# Patient Record
Sex: Male | Born: 1985 | Race: Black or African American | Hispanic: No | Marital: Single | Smoking: Current some day smoker
Health system: Southern US, Community
[De-identification: ages and names within clinical notes are randomized; demographics above are authoritative.]

## PROBLEM LIST (undated history)

## (undated) DIAGNOSIS — Z720 Tobacco use: Secondary | ICD-10-CM

---

## 2007-11-30 ENCOUNTER — Emergency Department (HOSPITAL_COMMUNITY): Admission: EM | Admit: 2007-11-30 | Discharge: 2007-12-01 | Payer: Self-pay | Admitting: Emergency Medicine

## 2009-02-11 ENCOUNTER — Emergency Department (HOSPITAL_COMMUNITY): Admission: EM | Admit: 2009-02-11 | Discharge: 2009-02-11 | Payer: Self-pay | Admitting: Emergency Medicine

## 2009-07-03 ENCOUNTER — Emergency Department (HOSPITAL_COMMUNITY): Admission: EM | Admit: 2009-07-03 | Discharge: 2009-07-03 | Payer: Self-pay | Admitting: Emergency Medicine

## 2010-08-18 ENCOUNTER — Emergency Department (HOSPITAL_COMMUNITY)
Admission: EM | Admit: 2010-08-18 | Discharge: 2010-08-18 | Payer: Self-pay | Source: Home / Self Care | Admitting: Emergency Medicine

## 2010-09-27 ENCOUNTER — Emergency Department (HOSPITAL_COMMUNITY)
Admission: EM | Admit: 2010-09-27 | Discharge: 2010-09-27 | Disposition: A | Discharge status: Home / Self Care | Payer: Self-pay | Attending: Emergency Medicine | Admitting: Emergency Medicine

## 2010-09-27 DIAGNOSIS — J029 Acute pharyngitis, unspecified: Secondary | ICD-10-CM | POA: Insufficient documentation

## 2010-09-27 DIAGNOSIS — R51 Headache: Secondary | ICD-10-CM | POA: Insufficient documentation

## 2010-09-27 DIAGNOSIS — R5381 Other malaise: Secondary | ICD-10-CM | POA: Insufficient documentation

## 2010-09-27 DIAGNOSIS — B9789 Other viral agents as the cause of diseases classified elsewhere: Secondary | ICD-10-CM | POA: Insufficient documentation

## 2010-09-27 DIAGNOSIS — R1110 Vomiting, unspecified: Secondary | ICD-10-CM | POA: Insufficient documentation

## 2010-10-28 LAB — POCT I-STAT, CHEM 8
BUN: 11 mg/dL (ref 6–23)
Chloride: 103 mEq/L (ref 96–112)
Creatinine, Ser: 1.2 mg/dL (ref 0.4–1.5)
Sodium: 144 mEq/L (ref 135–145)

## 2010-10-28 LAB — POCT CARDIAC MARKERS
Myoglobin, poc: 61.7 ng/mL (ref 12–200)
Troponin i, poc: <0.05 ng/mL (ref 0.00–0.09)

## 2010-10-28 LAB — RAPID URINE DRUG SCREEN, HOSP PERFORMED
Cocaine: NOT DETECTED
Opiates: NOT DETECTED
Tetrahydrocannabinol: NOT DETECTED

## 2010-11-18 ENCOUNTER — Emergency Department (HOSPITAL_COMMUNITY)
Admission: EM | Admit: 2010-11-18 | Discharge: 2010-11-19 | Disposition: A | Discharge status: Home / Self Care | Payer: Self-pay | Attending: Emergency Medicine | Admitting: Emergency Medicine

## 2010-11-18 DIAGNOSIS — Z0389 Encounter for observation for other suspected diseases and conditions ruled out: Secondary | ICD-10-CM | POA: Insufficient documentation

## 2010-11-25 LAB — BASIC METABOLIC PANEL
Calcium: 9.4 mg/dL (ref 8.4–10.5)
Creatinine, Ser: 0.79 mg/dL (ref 0.4–1.5)
GFR calc Af Amer: >60 mL/min (ref >60)
GFR calc non Af Amer: >60 mL/min (ref >60)
Sodium: 143 mEq/L (ref 135–145)

## 2010-11-25 LAB — DIFFERENTIAL
Basophils Relative: 1 % (ref 0–1)
Lymphocytes Relative: 29 % (ref 12–46)
Lymphs Abs: 2 10*3/uL (ref 0.7–4.0)
Monocytes Relative: 8 % (ref 3–12)
Neutro Abs: 4 10*3/uL (ref 1.7–7.7)
Neutrophils Relative %: 60 % (ref 43–77)

## 2010-11-25 LAB — POCT CARDIAC MARKERS
CKMB, poc: <1 ng/mL — ABNORMAL LOW (ref 1.0–8.0)
Myoglobin, poc: 40 ng/mL (ref 12–200)
Troponin i, poc: <0.05 ng/mL (ref 0.00–0.09)
Troponin i, poc: <0.05 ng/mL (ref 0.00–0.09)

## 2010-11-25 LAB — RAPID URINE DRUG SCREEN, HOSP PERFORMED
Benzodiazepines: NOT DETECTED
Cocaine: NOT DETECTED
Opiates: NOT DETECTED
Tetrahydrocannabinol: POSITIVE — AB

## 2010-11-25 LAB — CBC
Hemoglobin: 15.6 g/dL (ref 13.0–17.0)
MCHC: 32.7 g/dL (ref 30.0–36.0)
RBC: 5.25 MIL/uL (ref 4.22–5.81)
WBC: 6.7 10*3/uL (ref 4.0–10.5)

## 2013-02-15 ENCOUNTER — Encounter (HOSPITAL_COMMUNITY): Payer: Self-pay | Admitting: Emergency Medicine

## 2013-02-15 ENCOUNTER — Emergency Department (HOSPITAL_COMMUNITY)
Admission: EM | Admit: 2013-02-15 | Discharge: 2013-02-15 | Disposition: A | Discharge status: Home / Self Care | Payer: Self-pay | Attending: Emergency Medicine | Admitting: Emergency Medicine

## 2013-02-15 DIAGNOSIS — L0291 Cutaneous abscess, unspecified: Secondary | ICD-10-CM

## 2013-02-15 DIAGNOSIS — F17200 Nicotine dependence, unspecified, uncomplicated: Secondary | ICD-10-CM | POA: Insufficient documentation

## 2013-02-15 DIAGNOSIS — L02419 Cutaneous abscess of limb, unspecified: Secondary | ICD-10-CM | POA: Insufficient documentation

## 2013-02-15 MED ORDER — HYDROCODONE-ACETAMINOPHEN 5-325 MG PO TABS: 1.0000 | ORAL_TABLET | ORAL | Status: DC | PRN

## 2013-02-15 MED ORDER — SULFAMETHOXAZOLE-TRIMETHOPRIM 800-160 MG PO TABS: 1.0000 | ORAL_TABLET | Freq: Two times a day (BID) | ORAL | Status: DC

## 2013-02-15 NOTE — ED Notes (Signed)
PT. REPORTS ABSCESS AT LEFT HIP WITH DRAINAGE FOR 4 DAYS .

## 2013-02-15 NOTE — ED Notes (Signed)
Patient presents with abscess to left upper thigh. Reports that it started off as a "scractch" x several days. Had a Band-Aid over site. Patient removed Band-Aid 2 days ago. Since then, area started growing in size, warm and red. Patient used warm compress, epsom salt and abx ointment to area then covered the wound. Patient removed cover about 14 hours ago and noted that it was opened and bleeding. Now with red induration and purulent drainage. Denies any fevers, sweats or chills.

## 2013-02-15 NOTE — ED Provider Notes (Signed)
Medical screening examination/treatment/procedure(s) were performed by non-physician practitioner and as supervising physician I was immediately available for consultation/collaboration.  Doug Sou, MD 02/15/13 [phone]

## 2013-02-15 NOTE — ED Provider Notes (Signed)
   History    CSN: [id number] Arrival date & time 02/15/13  0309  First MD Initiated Contact with Patient 02/15/13 [phone]     Chief Complaint  Patient presents with  . Abscess   (Consider location/radiation/quality/duration/timing/severity/associated sxs/prior Treatment) Patient is a 27 y.o. male presenting with abscess. The history is provided by the patient. No language interpreter was used.  Abscess Location:  Leg Abscess quality: draining, painful and redness   Red streaking: no   Associated symptoms: no fever   Associated symptoms comment:  Painful swollen area to left hip x 4 days that has opened and is draining. No fever. No history of abscess.   History reviewed. No pertinent past medical history. History reviewed. No pertinent past surgical history. No family history on file. History  Substance Use Topics  . Smoking status: Current Every Day Smoker  . Smokeless tobacco: Not on file  . Alcohol Use: Yes    Review of Systems  Constitutional: Negative for fever.  Musculoskeletal: Negative for myalgias.  Skin: Positive for wound.    Allergies  Review of patient's allergies indicates no known allergies.  Home Medications   Current Outpatient Rx  Name  Route  Sig  Dispense  Refill  . acetaminophen (TYLENOL) 500 MG tablet   Oral   Take 1,000 mg by mouth every 6 (six) hours as needed for pain.         Marland Kitchen ibuprofen (ADVIL,MOTRIN) 200 MG tablet   Oral   Take 800 mg by mouth every 6 (six) hours as needed for pain.          BP 105/62  Pulse 115  Temp(Src) 98.1 F (36.7 C) (Oral)  Resp 14  SpO2 98% Physical Exam  Constitutional: He is oriented to person, place, and time. He appears well-developed and well-nourished.  Neck: Normal range of motion.  Pulmonary/Chest: Effort normal.  Musculoskeletal: Normal range of motion.  Neurological: He is alert and oriented to person, place, and time.  Skin: Skin is warm and dry.  Left anterior hip along iliac crest is a  swollen, tender lesion with central ulceration c/w cutaneous abscess.   Psychiatric: He has a normal mood and affect.    ED Course  Procedures (including critical care time) Labs Reviewed - No data to display No results found. No diagnosis found. 1. Cutaneous abscess  MDM  Miminal induration without current fluctuance. Active drainage and evidence of healing in at present. No indication for I&D.   Arnoldo Hooker, PA-C 02/15/13 [phone]

## 2016-11-03 ENCOUNTER — Encounter (HOSPITAL_COMMUNITY): Payer: Self-pay | Admitting: Emergency Medicine

## 2016-11-03 ENCOUNTER — Ambulatory Visit (HOSPITAL_COMMUNITY)
Admission: EM | Admit: 2016-11-03 | Discharge: 2016-11-03 | Disposition: A | Discharge status: Home / Self Care | Payer: Self-pay | Attending: Family Medicine | Admitting: Family Medicine

## 2016-11-03 DIAGNOSIS — R369 Urethral discharge, unspecified: Secondary | ICD-10-CM | POA: Insufficient documentation

## 2016-11-03 DIAGNOSIS — Z202 Contact with and (suspected) exposure to infections with a predominantly sexual mode of transmission: Secondary | ICD-10-CM | POA: Insufficient documentation

## 2016-11-03 MED ORDER — CEFTRIAXONE SODIUM 250 MG IJ SOLR
INTRAMUSCULAR | Status: AC
  Filled 2016-11-03: qty 250

## 2016-11-03 MED ORDER — LIDOCAINE HCL (PF) 1 % IJ SOLN
INTRAMUSCULAR | Status: AC
  Filled 2016-11-03: qty 2

## 2016-11-03 MED ORDER — AZITHROMYCIN 250 MG PO TABS
ORAL_TABLET | ORAL | Status: AC
  Filled 2016-11-03: qty 4

## 2016-11-03 MED ORDER — CEFTRIAXONE SODIUM 250 MG IJ SOLR
250.0000 mg | Freq: Once | INTRAMUSCULAR | Status: AC
  Administered 2016-11-03: 250 mg via INTRAMUSCULAR

## 2016-11-03 MED ORDER — AZITHROMYCIN 250 MG PO TABS
1000.0000 mg | ORAL_TABLET | Freq: Once | ORAL | Status: AC
  Administered 2016-11-03: 1000 mg via ORAL

## 2016-11-03 NOTE — Discharge Instructions (Signed)
If you do not improve, establish with a primary care physician. If new symptoms arise, seek care.  Wear protection if/when sexually active.

## 2016-11-03 NOTE — ED Provider Notes (Signed)
[facility]    CSN: [id number] Arrival date & time: 11/03/16  1641     History   Chief Complaint Chief Complaint  Patient presents with  . Exposure to STD    HPI Gary Ware is a 31 y.o. male.   HPI  Around 1 week ago, the patient started experiencing a clear penile discharge. He is not been sexually active in around 1 year. He has no history of herpes, chlamydia, gonorrhea, or HIV. He denies any fevers, abdominal pain, testicular pain, pain with urination, or bleeding. He is not known to have a partner with any sexual transmitted disease.  History reviewed. No pertinent past medical history.  History reviewed. No pertinent surgical history.   Home Medications    Prior to Admission medications   Medication Sig Start Date End Date Taking? Authorizing Provider  acetaminophen (TYLENOL) 500 MG tablet Take 1,000 mg by mouth every 6 (six) hours as needed for pain.    Historical Provider, MD  ibuprofen (ADVIL,MOTRIN) 200 MG tablet Take 800 mg by mouth every 6 (six) hours as needed for pain.    Historical Provider, MD    Family History No hx of cancer  Social History Social History  Substance Use Topics  . Smoking status: Current Every Day Smoker  . Alcohol use Yes     Allergies   Patient has no known allergies.   Review of Systems Review of Systems  Constitutional: Negative for fever.  Genitourinary: Positive for discharge.     Physical Exam Triage Vital Signs ED Triage Vitals [11/03/16 1737]  Enc Vitals Group     BP 109/63     Pulse Rate 71     Resp 18     Temp 98.7 F (37.1 C)     Temp Source Oral     SpO2 100 %   Updated Vital Signs BP 109/63 (BP Location: Right Arm)   Pulse 71   Temp 98.7 F (37.1 C) (Oral)   Resp 18   SpO2 100%   Physical Exam  Constitutional: He appears well-developed and well-nourished. No distress.  HENT:  Head: Normocephalic and atraumatic.  Eyes: EOM are normal. Pupils are equal, round, and reactive  to light.  Cardiovascular: Normal rate and regular rhythm.   No murmur heard. Pulmonary/Chest: Effort normal and breath sounds normal. No respiratory distress.  Abdominal: Soft. Bowel sounds are normal. He exhibits no distension. There is no tenderness.  Genitourinary: Penis normal. No penile tenderness.  Skin: Skin is warm.  Psychiatric: He has a normal mood and affect. Judgment normal.    UC Treatments / Results  Procedures Procedures - none  Medications Ordered in UC Medications  azithromycin (ZITHROMAX) tablet 1,000 mg (1,000 mg Oral Given 11/03/16 1812)  cefTRIAXone (ROCEPHIN) injection 250 mg (250 mg Intramuscular Given 11/03/16 1813)     Initial Impression / Assessment and Plan / UC Course  I have reviewed the triage vital signs and the nursing notes.  Pertinent labs & imaging results that were available during my care of the patient were reviewed by me and considered in my medical decision making (see chart for details).     31 yo BM presents with penile discharge. We will empirically treat him for gonorrhea/chlamydia. Urine culture and STI panel ordered. Advised him to establish a primary care physician follow-up with if treatment isn't helpful and if he continues to have the discharge. The patient voiced understanding and agreement to the plan.  Final Clinical Impressions(s) / UC  Diagnoses   Final diagnoses:  Penile discharge     Jilda Rocheicholas Paul GirardWendling, DO 11/03/16 1830

## 2016-11-03 NOTE — ED Triage Notes (Signed)
Patient reports penile discharge started on 3/15

## 2016-11-03 NOTE — ED Notes (Signed)
Sent to the bathroom with instructions for dirty and clean urine

## 2016-11-04 LAB — URINE CYTOLOGY ANCILLARY ONLY
CHLAMYDIA, DNA PROBE: POSITIVE — AB
NEISSERIA GONORRHEA: POSITIVE — AB
TRICH (WINDOWPATH): NEGATIVE

## 2017-05-29 ENCOUNTER — Other Ambulatory Visit: Payer: Self-pay

## 2017-05-30 ENCOUNTER — Emergency Department (HOSPITAL_COMMUNITY)
Admission: EM | Admit: 2017-05-30 | Discharge: 2017-05-30 | Disposition: A | Discharge status: Home / Self Care | Payer: Managed Care, Other (non HMO) | Attending: Emergency Medicine | Admitting: Emergency Medicine

## 2017-05-30 ENCOUNTER — Emergency Department (HOSPITAL_COMMUNITY): Payer: Managed Care, Other (non HMO)

## 2017-05-30 ENCOUNTER — Encounter (HOSPITAL_COMMUNITY): Payer: Self-pay | Admitting: Emergency Medicine

## 2017-05-30 DIAGNOSIS — F1520 Other stimulant dependence, uncomplicated: Secondary | ICD-10-CM

## 2017-05-30 DIAGNOSIS — R079 Chest pain, unspecified: Secondary | ICD-10-CM | POA: Diagnosis present

## 2017-05-30 DIAGNOSIS — F17200 Nicotine dependence, unspecified, uncomplicated: Secondary | ICD-10-CM | POA: Insufficient documentation

## 2017-05-30 LAB — BASIC METABOLIC PANEL
Anion gap: 8 (ref 5–15)
BUN: 11 mg/dL (ref 6–20)
CO2: 26 mmol/L (ref 22–32)
CREATININE: 0.79 mg/dL (ref 0.61–1.24)
Calcium: 9.3 mg/dL (ref 8.9–10.3)
Chloride: 102 mmol/L (ref 101–111)
GFR calc Af Amer: >60 mL/min (ref >60)
GLUCOSE: 98 mg/dL (ref 65–99)
POTASSIUM: 3.9 mmol/L (ref 3.5–5.1)
Sodium: 136 mmol/L (ref 135–145)

## 2017-05-30 LAB — I-STAT TROPONIN, ED
TROPONIN I, POC: 0 ng/mL (ref 0.00–0.08)
Troponin i, poc: 0.01 ng/mL (ref 0.00–0.08)

## 2017-05-30 LAB — CBC
HEMATOCRIT: 42.3 % (ref 39.0–52.0)
HEMOGLOBIN: 14.2 g/dL (ref 13.0–17.0)
MCH: 29.8 pg (ref 26.0–34.0)
MCHC: 33.6 g/dL (ref 30.0–36.0)
MCV: 88.7 fL (ref 78.0–100.0)
Platelets: 256 10*3/uL (ref 150–400)
RBC: 4.77 MIL/uL (ref 4.22–5.81)
RDW: 12.4 % (ref 11.5–15.5)
WBC: 4.7 10*3/uL (ref 4.0–10.5)

## 2017-05-30 MED ORDER — KETOROLAC TROMETHAMINE 60 MG/2ML IM SOLN
30.0000 mg | Freq: Once | INTRAMUSCULAR | Status: AC
  Administered 2017-05-30: 30 mg via INTRAMUSCULAR
  Filled 2017-05-30: qty 2

## 2017-05-30 NOTE — ED Triage Notes (Signed)
C/o intermittent sharp pain to L chest with mild intermittent sob and nausea x 2-3 days.  Also reports generalized body aches.  Denies fever.

## 2017-05-30 NOTE — ED Notes (Signed)
Got patient up and ready for discharge

## 2017-05-30 NOTE — ED Provider Notes (Signed)
[facility]   CSN: [id number] Arrival date & time: 05/30/17  0001     History   Chief Complaint Chief Complaint  Patient presents with  . Chest Pain    HPI Gary Ware is a 31 y.o. male.  The history is provided by the patient and medical records. No language interpreter was used.  Chest Pain   Associated symptoms include shortness of breath. Pertinent negatives include no cough.   Gary Ware is an otherwise healthy 31 y.o. male who presents to the Emergency Department complaining of intermittent sharp, tingling left-sided chest pain x 2 days. Pain occurred 2-3 times the first day, lasting about 15-30 minutes then self-resolving. Last night, pain began and lasted over an hour, prompting him to come to ER for evaluation. Currently feels a little sore, but pain resolved. Initially did have some shortness of breath, nausea and "jitters", but that also has resolved. Of note, patient states that his job has been very stressful. 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 Last night, he also states that he drank two energy drinks along with a cup of black coffee. Last night was the first time that he took this much caffeine at one time. No recent travel, immobilizations or surgeries. No cough, congestion, fever, chills, back pain, abdominal pain. No hx of HTN, HLD, DM of heart disease. No family cardiac hx.   History reviewed. No pertinent past medical history.  There are no active problems to display for this patient.   History reviewed. No pertinent surgical history.     Home Medications    Prior to Admission medications   Medication Sig Start Date End Date Taking? Authorizing Provider  acetaminophen (TYLENOL) 500 MG tablet Take 1,000 mg by mouth every 6 (six) hours as needed for pain.   Yes [provider]  ibuprofen (ADVIL,MOTRIN) 200 MG tablet Take 800 mg by mouth every 6 (six)  hours as needed for pain.   Yes [provider]    Family History No family history on file.  Social History Social History  Substance Use Topics  . Smoking status: Current Every Day Smoker  . Smokeless tobacco: Never Used  . Alcohol use Yes     Allergies   Patient has no known allergies.   Review of Systems Review of Systems  Respiratory: Positive for shortness of breath. Negative for cough and wheezing.   Cardiovascular: Positive for chest pain. Negative for leg swelling.  All other systems reviewed and are negative.    Physical Exam Updated Vital Signs BP 120/72 (BP Location: Left Arm)   Pulse (!) 56   Temp 97.7 F (36.5 C) (Oral)   Resp 18   SpO2 100%   Physical Exam  Constitutional: He is oriented to person, place, and time. He appears well-developed and well-nourished. No distress.  HENT:  Head: Normocephalic and atraumatic.  Neck: Neck supple. No JVD present.  Cardiovascular: Normal rate and regular rhythm.   No murmur heard. Pulmonary/Chest: Effort normal and breath sounds normal. No respiratory distress. He has no wheezes. He has no rales. He exhibits tenderness.  Abdominal: Soft. He exhibits no distension. There is no tenderness.  Musculoskeletal: He exhibits no edema.  Neurological: He is alert and oriented to person, place, and time.  Skin: Skin is warm and dry.  Nursing note and vitals reviewed.    ED Treatments / Results  Labs (all labs  ordered are listed, but only abnormal results are displayed) Labs Reviewed  BASIC METABOLIC PANEL  CBC  I-STAT TROPONIN, ED  I-STAT TROPONIN, ED    EKG  EKG Interpretation  Date/Time:  Saturday May 30 2017 00:14:56 EDT Ventricular Rate:  65 PR Interval:  166 QRS Duration: 76 QT Interval:  400 QTC Calculation: 416 R Axis:   94 Text Interpretation:  Normal sinus rhythm with sinus arrhythmia Confirmed by Nicanor Alcon, April ([id number]) on 05/30/2017 8:07:16 AM       Radiology Dg Chest 2  View  Result Date: 05/30/2017 CLINICAL DATA:  Intermittent sharp left chest pain with dyspnea and nausea for several days. EXAM: CHEST  2 VIEW COMPARISON:  08/18/2010 FINDINGS: The lungs are clear. The pulmonary vasculature is normal. Heart size is normal. Hilar and mediastinal contours are unremarkable. There is no pleural effusion. IMPRESSION: No active cardiopulmonary disease. Electronically Signed   By: Ellery Plunk M.D.   On: 05/30/2017 00:52    Procedures Procedures (including critical care time)  Medications Ordered in ED Medications  ketorolac (TORADOL) injection 30 mg (30 mg Intramuscular Given 05/30/17 0655)     Initial Impression / Assessment and Plan / ED Course  I have reviewed the triage vital signs and the nursing notes.  Pertinent labs & imaging results that were available during my care of the patient were reviewed by me and considered in my medical decision making (see chart for details).    Gary Ware is a 31 y.o. male who presents to ED for chest pain.  Labs reviewed and reassuring with negative troponin x2.  CXR with no acute abnormalities.  EKG reassuring.  Heart score 1. PERC negative  Labs and imaging reviewed again prior to discharge. Patient has been advised to return to the ED if development of any exertional chest pain, trouble breathing, new/worsening symptoms or for any additional concerns. Evaluation does not show pathology that would require ongoing emergent intervention or inpatient treatment. Encouraged to follow up with cardiology. Referral information included in discharge paperwork. Patient understands return precautions and follow up plan. All questions answered.   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

## 2017-05-30 NOTE — Discharge Instructions (Signed)
It was my pleasure taking care of you today!   Decrease caffeine intake!   You were seen in the Emergency Department today for chest pain.  As we have discussed, today?s blood work and imaging are normal, but you may require further testing.  Please call the cardiology clinic listed to schedule a follow up appointment for further evaluation of the chest pain you experienced today. This follow up appointment is very important!  Return to the Emergency Department if you experience any further chest pain/pressure/tightness, difficulty breathing, sudden sweating, or other symptoms that concern you.

## 2017-10-08 ENCOUNTER — Emergency Department (HOSPITAL_COMMUNITY)
Admission: EM | Admit: 2017-10-08 | Discharge: 2017-10-08 | Disposition: A | Discharge status: Home / Self Care | Payer: Managed Care, Other (non HMO) | Attending: Emergency Medicine | Admitting: Emergency Medicine

## 2017-10-08 ENCOUNTER — Other Ambulatory Visit: Payer: Self-pay

## 2017-10-08 ENCOUNTER — Encounter (HOSPITAL_COMMUNITY): Payer: Self-pay | Admitting: *Deleted

## 2017-10-08 ENCOUNTER — Emergency Department (HOSPITAL_COMMUNITY): Payer: Managed Care, Other (non HMO)

## 2017-10-08 DIAGNOSIS — R079 Chest pain, unspecified: Secondary | ICD-10-CM

## 2017-10-08 DIAGNOSIS — M898X1 Other specified disorders of bone, shoulder: Secondary | ICD-10-CM

## 2017-10-08 DIAGNOSIS — R9431 Abnormal electrocardiogram [ECG] [EKG]: Secondary | ICD-10-CM | POA: Insufficient documentation

## 2017-10-08 DIAGNOSIS — M25512 Pain in left shoulder: Secondary | ICD-10-CM | POA: Diagnosis present

## 2017-10-08 DIAGNOSIS — F17200 Nicotine dependence, unspecified, uncomplicated: Secondary | ICD-10-CM | POA: Diagnosis not present

## 2017-10-08 DIAGNOSIS — G8929 Other chronic pain: Secondary | ICD-10-CM | POA: Diagnosis not present

## 2017-10-08 HISTORY — DX: Tobacco use: Z72.0

## 2017-10-08 LAB — BASIC METABOLIC PANEL
Anion gap: 12 (ref 5–15)
BUN: 8 mg/dL (ref 6–20)
CALCIUM: 9.8 mg/dL (ref 8.9–10.3)
CO2: 24 mmol/L (ref 22–32)
CREATININE: 0.73 mg/dL (ref 0.61–1.24)
Chloride: 103 mmol/L (ref 101–111)
GFR calc non Af Amer: >60 mL/min (ref >60)
GLUCOSE: 88 mg/dL (ref 65–99)
Potassium: 3.9 mmol/L (ref 3.5–5.1)
Sodium: 139 mmol/L (ref 135–145)

## 2017-10-08 LAB — CBC
HCT: 45.9 % (ref 39.0–52.0)
Hemoglobin: 15.5 g/dL (ref 13.0–17.0)
MCH: 30.5 pg (ref 26.0–34.0)
MCHC: 33.8 g/dL (ref 30.0–36.0)
MCV: 90.4 fL (ref 78.0–100.0)
PLATELETS: 329 10*3/uL (ref 150–400)
RBC: 5.08 MIL/uL (ref 4.22–5.81)
RDW: 12.7 % (ref 11.5–15.5)
WBC: 5.9 10*3/uL (ref 4.0–10.5)

## 2017-10-08 LAB — C-REACTIVE PROTEIN: CRP: <0.8 mg/dL (ref <1.0)

## 2017-10-08 LAB — SEDIMENTATION RATE: Sed Rate: 9 mm/hr (ref 0–16)

## 2017-10-08 LAB — I-STAT TROPONIN, ED: TROPONIN I, POC: 0 ng/mL (ref 0.00–0.08)

## 2017-10-08 LAB — D-DIMER, QUANTITATIVE: D-Dimer, Quant: <0.27 ug/mL-FEU (ref 0.00–0.50)

## 2017-10-08 MED ORDER — PANTOPRAZOLE SODIUM 20 MG PO TBEC: 20.0000 mg | DELAYED_RELEASE_TABLET | Freq: Every day | ORAL | 0 refills | Status: DC

## 2017-10-08 NOTE — ED Triage Notes (Signed)
Pt states woke up yesterday with left shoulder blade pain that hurts with movement and palpation of area.  No chest pain

## 2017-10-08 NOTE — ED Notes (Signed)
D-dimer label sent to main lab. Per main lab, will call if blue top specimen not in main lab.

## 2017-10-08 NOTE — ED Notes (Signed)
XR at bedside

## 2017-10-08 NOTE — ED Notes (Signed)
Pt's name called x4 for triage.  No response.

## 2017-10-08 NOTE — ED Notes (Signed)
Cardiology at bedside.

## 2017-10-08 NOTE — ED Notes (Signed)
While Pt was in A05, he told this tech that he had experienced similar shoulder pain last week when he drank "an energy drink or two and a caffeine pill".

## 2017-10-08 NOTE — Discharge Instructions (Signed)
Take ibuprofen 400 mg every 8 hours-do not use longer than 1-2 weeks Use protonix Follow up with primary care doctor in 1-2 weeks

## 2017-10-08 NOTE — Consult Note (Addendum)
Cardiology Consultation:   Patient ID: Gary Ware; [id number]; 17-Nov-1985   Admit date: 10/08/2017 Date of Consult: 10/08/2017  Primary Care Provider: Patient, No Pcp Per Primary Cardiologist: Kristeen Miss, MD - new Primary Electrophysiologist:     Patient Profile:   Gary Ware is a 32 y.o. male with a hx of upper respiratory tract infection, suspected to be viral, who is being seen today for the evaluation of chest pain at the request of Dr. Rosalia Hammers.  History of Present Illness:   Mr. Kluever has a PMH significant for smoking. He presented to [facility] after waking up yesterday with left subscapular pain described as sharp. He woke up and then noticed the pain rated as a 4/10. He states the pain has been constant, but waxes and wanes in intensity. He states his left scapular pain lasted all day yesterday and he again awoke this morning with the pain and decided to be evaluated. The pain hurts with movement of his extremities and is reproducible by palpation on exam. The pain hurts worse when he lays down flat to sleep. He stated that he was unable to bend over to pick up something on the floor this morning because the pain was worse with bending over. He denies associated symptoms. 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 His job is physically demanding and he denies exertional chest pain and DOE. He denies problems with bleeding, lower extremity swelling, palpitations, and elevated blood pressure. He had a viral URI/head cold last week and is about 95% over it today. He denied fever. He does not have a family history of heart disease. He is a current smoker who quit 2 days ago at the request of his 17 yo daughter. He has no other risk factors for ACS.  In the ER, initial troponin negative. EKG with diffuse ST elevations. Reviewed with Dr. Eldridge Dace and STEMI was not called. EKG with LVH. Blood pressure controlled in ED. He is currently chest pain free while sitting up at almost a 90 degree  angle. He states the pain subsided 15-20 min prior.  Of note, the patient was seen in the ED for chest pain 05/30/17 thought to related to intake of copious amounts of caffeine. He was discharged without further ischemic workup.    Past Medical History:  Diagnosis Date  . Tobacco use     History reviewed. No pertinent surgical history.   Home Medications:  Prior to Admission medications   Medication Sig Start Date End Date Taking? Authorizing Provider  acetaminophen (TYLENOL) 500 MG tablet Take 1,000 mg by mouth every 6 (six) hours as needed for pain.    [provider]  ibuprofen (ADVIL,MOTRIN) 200 MG tablet Take 800 mg by mouth every 6 (six) hours as needed for pain.    [provider]    Inpatient Medications: Scheduled Meds:  Continuous Infusions:  PRN Meds:   Allergies:   No Known Allergies  Social History:   Social History   Socioeconomic History  . Marital status: Single    Spouse name: Not on file  . Number of children: Not on file  . Years of education: Not on file  . Highest education level: Not on file  Social Needs  . Financial resource strain: Not on file  . Food insecurity - worry: Not on file  . Food insecurity - inability: Not on file  . Transportation needs - medical: Not on file  . Transportation needs - non-medical: Not on file  Occupational History  .  Not on file  Tobacco Use  . Smoking status: Current Every Day Smoker  . Smokeless tobacco: Never Used  Substance and Sexual Activity  . Alcohol use: Yes  . Drug use: No  . Sexual activity: Not on file  Other Topics Concern  . Not on file  Social History Narrative  . Not on file    Family History:    Family History  Problem Relation Age of Onset  . Hypertension Maternal Grandmother      ROS:  Please see the history of present illness.  All other ROS reviewed and negative.     Physical Exam/Data:   Vitals:   10/08/17 1200 10/08/17 1215 10/08/17 1230 10/08/17 1246   BP:  105/76 116/73 114/69  Pulse: 76 64 (!) 58 85  Resp: 13 18 18 17   Temp:      TempSrc:      SpO2: 100% 100% 100% 100%   No intake or output data in the 24 hours ending 10/08/17 1309 There were no vitals filed for this visit. There is no height or weight on file to calculate BMI.  General:  Well nourished, well developed, in no acute distress HEENT: normal Neck: no JVD Vascular: No carotid bruits  Cardiac:  normal S1, S2; RRR; no murmur; pain with palpation along his left scapula, pain with upper extremity movement Lungs:  clear to auscultation bilaterally, no wheezing, rhonchi or rales  Abd: soft, nontender, no hepatomegaly  Ext: no edema Musculoskeletal:  No deformities, BUE and BLE strength normal and equal Skin: warm and dry  Neuro:  CNs 2-12 intact, no focal abnormalities noted Psych:  Normal affect   EKG:  The EKG was personally reviewed and demonstrates:  Diffuse ST elevation and LVH Telemetry:  Telemetry was personally reviewed and demonstrates:  sinus  Relevant CV Studies:  none  Laboratory Data:  Chemistry Recent Labs  Lab 10/08/17 1130  NA 139  K 3.9  CL 103  CO2 24  GLUCOSE 88  BUN 8  CREATININE 0.73  CALCIUM 9.8  GFRNONAA >60  GFRAA >60  ANIONGAP 12    No results for input(s): PROT, ALBUMIN, AST, ALT, ALKPHOS, BILITOT in the last 168 hours. Hematology Recent Labs  Lab 10/08/17 1130  WBC 5.9  RBC 5.08  HGB 15.5  HCT 45.9  MCV 90.4  MCH 30.5  MCHC 33.8  RDW 12.7  PLT 329   Cardiac EnzymesNo results for input(s): TROPONINI in the last 168 hours.  Recent Labs  Lab 10/08/17 1135  TROPIPOC 0.00    BNPNo results for input(s): BNP, PROBNP in the last 168 hours.  DDimer No results for input(s): DDIMER in the last 168 hours.  Radiology/Studies:  Dg Chest Port 1 View  Result Date: 10/08/2017 CLINICAL DATA:  Chest pain with abnormal electrocardiogram EXAM: PORTABLE CHEST 1 VIEW COMPARISON:  May 30, 2017 FINDINGS: There is no edema or  consolidation. Heart size and pulmonary vascularity are normal. No adenopathy. No pneumothorax. No bone lesions. IMPRESSION: No edema or consolidation. Electronically Signed   By: Bretta Bang III M.D.   On: 10/08/2017 12:05    Assessment and Plan:   1. Chest pain with EKG changes Given his atypical symptoms, pain worse with inspiration, laying flat, bending over, and palpation on exam, EKG changes, and recent viral URI, I suspect this chest pain may be related to his prior URI. He is currently chest pain free while sitting nearly 90 degrees. He is a smoker, but quit two days  ago. He has no other ACS risk factors.   Will order sed rate and CRP. If normal, Ok to discharge. Recommend treating with 600-800 mg ibuprofen TID for 1-2 weeks. Also discharge on PPI.    2. LVH Instructed patient to take his blood pressure at home.    3. Tobacco use Pt states he quit 2 days ago at the request of his 795 yo daughter. Congratulated and encouraged cessation.     For questions or updates, please contact [facility] Please consult [email or web address] for contact info under Cardiology/STEMI.   Signed, Marcelino Dusterngela Nicole Duke, [place]  10/08/2017 1:09 PM  Attending Note:   The patient was seen and examined.  Agree with assessment and plan as noted above.  Changes made to the above note as needed.  Patient seen and independently examined with Bettina GaviaAngie Duke, PA .   We discussed all aspects of the encounter. I agree with the assessment and plan as stated above.  1.  Chest discomfort: The patient's chest pain is very atypical.  It sounds either musculoskeletal but also could be due to pleurisy.  Tends to worsen with twisting or turning of his torso and also taking a deep breath.  He does not necessarily get worse with lying supine in bed or sitting upright.  He does not have a rub and does not have evidence of pericarditis on EKG.  He does have early repolarization which is very common for young black  males.   Committed he take Motrin 400 mg to 600 mg 3 times a day for the next several days.  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  He thinks that he can get back to work on Saturday.  I told him I be happy to see him if he has any further episodes of chest discomfort that sounds concerning for cardiac disease.  He he will need to get a primary medical doctor.   I have spent a total of 40 minutes with patient reviewing hospital  notes , telemetry, EKGs, labs and examining patient as well as establishing an assessment and plan that was discussed with the patient. > 50% of time was spent in direct patient care.    Vesta MixerPhilip J. Nahser, Montez HagemanJr., MD, Erlanger North HospitalFACC 10/08/2017, 3:53 PM [address],  [address] Office [phone]- [phone] Pager [phone]

## 2017-10-08 NOTE — ED Provider Notes (Addendum)
[facility] EMERGENCY DEPARTMENT Provider Note   CSN: [id number] Arrival date & time: 10/08/17  4098     History   Chief Complaint Chief Complaint  Patient presents with  . Shoulder Pain  . Abnormal ECG    HPI Gary Ware is a 32 y.o. male.  HPI  32 year old male presents today with 24 hours of waxing and waning left subscapular sharp pain.  He describes it as coming and going and lasting only 4 minutes.  From my evaluation he is not having it.  However he did have it initially and an EKG was obtained.  He denies any other symptoms.  He has had a recent viral URI symptoms.  He denies shortness of breath, anterior or substernal chest pain, sweating, nausea, vomiting, or diarrhea.  He has a history of cigarette smoking but states he quit 2 days ago and at that time it was only occasional.  He denies any other smoking or drug use.  He does not have any significant family history of coronary artery disease.  He denies any history of DVT or PE.  History reviewed. No pertinent past medical history.  There are no active problems to display for this patient.   History reviewed. No pertinent surgical history.     Home Medications    Prior to Admission medications   Medication Sig Start Date End Date Taking? Authorizing Provider  acetaminophen (TYLENOL) 500 MG tablet Take 1,000 mg by mouth every 6 (six) hours as needed for pain.    [provider]  ibuprofen (ADVIL,MOTRIN) 200 MG tablet Take 800 mg by mouth every 6 (six) hours as needed for pain.    [provider]    Family History No family history on file.  Social History Social History   Tobacco Use  . Smoking status: Current Every Day Smoker  . Smokeless tobacco: Never Used  Substance Use Topics  . Alcohol use: Yes  . Drug use: No     Allergies   Patient has no known allergies.   Review of Systems Review of Systems  All other systems reviewed and are  negative.    Physical Exam Updated Vital Signs BP 118/72 (BP Location: Right Arm)   Pulse 84   Temp 98.4 F (36.9 C) (Oral)   Resp 18   SpO2 98%   Physical Exam  Constitutional: He is oriented to person, place, and time. He appears well-developed and well-nourished.  HENT:  Head: Normocephalic.  Right Ear: External ear normal.  Left Ear: External ear normal.  Mouth/Throat: Oropharynx is clear and moist.  Eyes: Pupils are equal, round, and reactive to light.  Neck: Normal range of motion.  Cardiovascular: Normal rate, regular rhythm and normal heart sounds.  No murmur or rub noted  Pulmonary/Chest: Effort normal and breath sounds normal.  Abdominal: Soft. Bowel sounds are normal.  Musculoskeletal: Normal range of motion.  Neurological: He is oriented to person, place, and time.  Skin: Skin is warm and dry. Capillary refill takes less than 2 seconds.  Psychiatric: He has a normal mood and affect.  Nursing note and vitals reviewed.    ED Treatments / Results  Labs (all labs ordered are listed, but only abnormal results are displayed) Labs Reviewed  BASIC METABOLIC PANEL  CBC  I-STAT TROPONIN, ED    EKG  EKG Interpretation  Date/Time:  Thursday October 08 2017 11:14:18 EST Ventricular Rate:  85 PR Interval:    QRS Duration: 78 QT Interval:  351 QTC Calculation: 418 R Axis:   108 Text Interpretation:  Sinus rhythm LVH by voltage Inferior infarct, acute (LCx) Lateral leads are also involved Confirmed by Margarita Grizzleay, Denise Washburn [phone]([id number]) on 10/08/2017 11:47:38 AM       EKG Interpretation  Date/Time:  Thursday October 08 2017 11:39:26 EST Ventricular Rate:  57 PR Interval:    QRS Duration: 94 QT Interval:  408 QTC Calculation: 398 R Axis:   109 Text Interpretation:  Sinus arrhythmia Right axis deviation ST elevation suggests acute pericarditis Confirmed by Margarita Grizzleay, Karter Hellmer [phone]([id number]) on 10/08/2017 11:55:44 AM       Radiology Dg Chest Port 1 View  Result Date:  10/08/2017 CLINICAL DATA:  Chest pain with abnormal electrocardiogram EXAM: PORTABLE CHEST 1 VIEW COMPARISON:  May 30, 2017 FINDINGS: There is no edema or consolidation. Heart size and pulmonary vascularity are normal. No adenopathy. No pneumothorax. No bone lesions. IMPRESSION: No edema or consolidation. Electronically Signed   By: Bretta BangWilliam  Woodruff III M.D.   On: 10/08/2017 12:05    Procedures Procedures (including critical care time)  Medications Ordered in ED Medications - No data to display   Initial Impression / Assessment and Plan / ED Course  I have reviewed the triage vital signs and the nursing notes.  Pertinent labs & imaging results that were available during my care of the patient were reviewed by me and considered in my medical decision making (see chart for details).    11:51 AM Discussed ekg changes with Dr. Eldridge DaceVaranasi- agrees with NOT calling STEMI as symptoms atypical.  Repeat ekg less pronounced.  Will evaluate for MI, other cardiac etiologies and pulmonary- specfically pe.  Troponin negative D-dimer normal  Reviewed cardiology note- thank you crp normal Will start protonix and ibuprofen Patient advised regarding need for follow up and return precautions  Final Clinical Impressions(s) / ED Diagnoses   Final diagnoses:  Chronic scapular pain  Abnormal EKG    ED Discharge Orders        Ordered    pantoprazole (PROTONIX) 20 MG tablet  Daily     10/08/17 1511       Margarita Grizzleay, Tova Vater, MD 10/08/17 1513    Margarita Grizzleay, Erandi Lemma, MD 10/08/17 [phone]

## 2017-10-10 ENCOUNTER — Encounter (HOSPITAL_COMMUNITY): Payer: Self-pay | Admitting: *Deleted

## 2017-10-10 ENCOUNTER — Ambulatory Visit (HOSPITAL_COMMUNITY)
Admission: EM | Admit: 2017-10-10 | Discharge: 2017-10-10 | Disposition: A | Discharge status: Home / Self Care | Payer: Managed Care, Other (non HMO) | Attending: Family Medicine | Admitting: Family Medicine

## 2017-10-10 ENCOUNTER — Other Ambulatory Visit: Payer: Self-pay

## 2017-10-10 DIAGNOSIS — M25512 Pain in left shoulder: Secondary | ICD-10-CM

## 2017-10-10 DIAGNOSIS — M545 Low back pain, unspecified: Secondary | ICD-10-CM

## 2017-10-10 MED ORDER — CYCLOBENZAPRINE HCL 10 MG PO TABS: 10.0000 mg | ORAL_TABLET | Freq: Two times a day (BID) | ORAL | 0 refills | Status: DC | PRN

## 2017-10-10 NOTE — ED Provider Notes (Signed)
  [facility]    CSN: [id number] Arrival date & time: 10/10/17  1927  Musculoskeletal Exam  Patient: Gary Ware DOB: 11/03/1985  DOS: 10/10/2017  SUBJECTIVE:  Chief Complaint:   Chief Complaint  Patient presents with  . Shoulder Pain    Gary Ware is a 32 y.o.  male for evaluation and treatment of shoulder pain.   Onset:  3 days ago.  No inj, seen in ED and rx'd meds given time off of work.  He is involved in physical work and does not believe he can return tonight or maybe tomorrow.  He has Monday off..   Today, he also started having lower back pain.  It is lower and central.  No injury or change in activity.  He does not stretch routinely.  No weakness, numbness, tingling, or loss of bowel/bladder function.  ROS: Musculoskeletal/Extremities: +LBP  Past Medical History:  Diagnosis Date  . Tobacco use     Objective: VITAL SIGNS: BP 118/72 (BP Location: Right Arm)   Pulse 73   Temp 97.6 F (36.4 C) (Oral)   SpO2 98%  Constitutional: Well formed, well developed. No acute distress. Cardiovascular: Brisk cap refill Thorax & Lungs: No accessory muscle use Musculoskeletal: Low back.   Tenderness to palpation: yes, lumbar paraspinal msk b/l Deformity: no Ecchymosis: no Tests negative: Straight leg Neurologic: Normal sensory function. No focal deficits noted. DTR's equal and symmetry in LE's. No clonus. Psychiatric: Normal mood. Age appropriate judgment and insight. Alert & oriented x 3.    Assessment:  Acute bilateral low back pain without sciatica  Acute pain of left shoulder  Plan: Add Flexeril. NSAID, Tylenol, stretches/exericses. Letter excusing him tonight and tomorrow night with return the evening of 10/12/17 given. Find pcp. The patient voiced understanding and agreement to the plan.    Sharlene DoryWendling, Shacoria Latif Paul, [place]DO 10/10/17 2253

## 2017-10-10 NOTE — ED Triage Notes (Signed)
Lower back and shoulder pain .

## 2017-10-10 NOTE — Discharge Instructions (Signed)
Heat (pad or rice pillow in microwave) over affected area, 10-15 minutes twice daily.   Ibuprofen 400-600 mg (2-3 over the counter strength tabs) every 6 hours as needed for pain.  OK to take Tylenol 1000 mg (2 extra strength tabs) or 975 mg (3 regular strength tabs) every 6 hours as needed.  Ice/cold pack over area for 10-15 min twice daily.  Keep stretching.

## 2017-11-01 ENCOUNTER — Encounter (HOSPITAL_COMMUNITY): Payer: Self-pay | Admitting: Emergency Medicine

## 2017-11-01 ENCOUNTER — Ambulatory Visit (HOSPITAL_COMMUNITY)
Admission: EM | Admit: 2017-11-01 | Discharge: 2017-11-01 | Disposition: A | Discharge status: Home / Self Care | Payer: Managed Care, Other (non HMO) | Attending: Physician Assistant | Admitting: Physician Assistant

## 2017-11-01 DIAGNOSIS — J069 Acute upper respiratory infection, unspecified: Secondary | ICD-10-CM

## 2017-11-01 MED ORDER — CETIRIZINE-PSEUDOEPHEDRINE ER 5-120 MG PO TB12: 1.0000 | ORAL_TABLET | Freq: Two times a day (BID) | ORAL | 0 refills | Status: AC

## 2017-11-01 MED ORDER — NAPROXEN 500 MG PO TABS: 500.0000 mg | ORAL_TABLET | Freq: Two times a day (BID) | ORAL | 0 refills | Status: DC

## 2017-11-01 NOTE — ED Triage Notes (Signed)
Pt c/o cough, sneezing, since yesterday. Pt also c/o scratchy throat.

## 2017-11-01 NOTE — Discharge Instructions (Signed)
Take medication as prescribed.  I think it smart to stay out of work for the next couple of days.  Drink lots and lots of water.

## 2017-11-01 NOTE — ED Provider Notes (Signed)
11/01/2017 3:51 PM   DOB: 12/08/1985 / MRN: [id number]  SUBJECTIVE:  Gary Ware is a 32 y.o. male presenting for cough, nasal congestion, scratchy throat.  Has not had the flu shot.  Has no history of asthma or diabetes.  Eyes fever, chest pain, shortness of breath, DOE, leg swelling.  He has No Known Allergies.   He  has a past medical history of Tobacco use.    He  reports that he has been smoking.  he has never used smokeless tobacco. He reports that he drinks alcohol. He reports that he does not use drugs. He  has no sexual activity history on file. The patient  has no past surgical history on file.  His family history includes Hypertension in his maternal grandmother.  ROS per HPI  OBJECTIVE:  BP 123/71   Pulse 81   Temp 99.2 F (37.3 C)   Resp 16   SpO2 100%   Physical Exam  Constitutional: He appears well-developed. He is active and cooperative.  Non-toxic appearance.  HENT:  Right Ear: Hearing, tympanic membrane, external ear and ear canal normal.  Left Ear: Hearing, tympanic membrane, external ear and ear canal normal.  Nose: Nose normal. Right sinus exhibits no maxillary sinus tenderness and no frontal sinus tenderness. Left sinus exhibits no maxillary sinus tenderness and no frontal sinus tenderness.  Mouth/Throat: Uvula is midline, oropharynx is clear and moist and mucous membranes are normal. No oropharyngeal exudate, posterior oropharyngeal edema or tonsillar abscesses.  Eyes: Conjunctivae are normal. Pupils are equal, round, and reactive to light.  Cardiovascular: Normal rate, regular rhythm, S1 normal, S2 normal, normal heart sounds, intact distal pulses and normal pulses. Exam reveals no gallop and no friction rub.  No murmur heard. Pulmonary/Chest: Effort normal. No stridor. No tachypnea. No respiratory distress. He has no wheezes. He has no rales.  Abdominal: He exhibits no distension.  Musculoskeletal: He exhibits no edema.  Lymphadenopathy:       Head  (right side): No submandibular and no tonsillar adenopathy present.       Head (left side): No submandibular and no tonsillar adenopathy present.    He has no cervical adenopathy.  Neurological: He is alert.  Skin: Skin is warm and dry. He is not diaphoretic. No pallor.  Vitals reviewed.   No results found for this or any previous visit (from the past 72 hour(s)).  No results found.  ASSESSMENT AND PLAN:  No orders of the defined types were placed in this encounter.    Acute URI - Most likely viral.  Will treat symptomatically.  He feels he would benefit from some time off.  I would take him out of work until next Tuesday.      The patient is advised to call or return to clinic if he does not see an improvement in symptoms, or to seek the care of the closest emergency department if he worsens with the above plan.   Deliah BostonMichael Clark, MHS, PA-C 11/01/2017 3:51 PM    Ofilia Neaslark, Michael L, PA-C 11/01/17 1551

## 2018-01-13 ENCOUNTER — Encounter (HOSPITAL_COMMUNITY): Payer: Self-pay | Admitting: Emergency Medicine

## 2018-01-13 ENCOUNTER — Ambulatory Visit (HOSPITAL_COMMUNITY)
Admission: EM | Admit: 2018-01-13 | Discharge: 2018-01-13 | Disposition: A | Discharge status: Home / Self Care | Payer: Managed Care, Other (non HMO) | Attending: Family Medicine | Admitting: Family Medicine

## 2018-01-13 DIAGNOSIS — J069 Acute upper respiratory infection, unspecified: Secondary | ICD-10-CM | POA: Insufficient documentation

## 2018-01-13 DIAGNOSIS — Z202 Contact with and (suspected) exposure to infections with a predominantly sexual mode of transmission: Secondary | ICD-10-CM | POA: Insufficient documentation

## 2018-01-13 DIAGNOSIS — F17210 Nicotine dependence, cigarettes, uncomplicated: Secondary | ICD-10-CM | POA: Diagnosis not present

## 2018-01-13 DIAGNOSIS — Z113 Encounter for screening for infections with a predominantly sexual mode of transmission: Secondary | ICD-10-CM

## 2018-01-13 MED ORDER — IBUPROFEN 800 MG PO TABS: 800.0000 mg | ORAL_TABLET | Freq: Three times a day (TID) | ORAL | 0 refills | Status: DC

## 2018-01-13 NOTE — Discharge Instructions (Addendum)
Follow up with your primary care doctor or here if you are not seeing improvement of your symptoms over the next several days, sooner if you feel you are worsening.  Caring for yourself: Get plenty of rest. Drink plenty of fluids, enough so that your urine is light yellow or clear like water. If you have kidney, heart, or liver disease and have to limit fluids, talk with your doctor before you increase the amount of fluids you drink. Take an over-the-counter pain medicine if needed, such as acetaminophen (Tylenol), ibuprofen (Advil, Motrin), or naproxen (Aleve), to relieve fever, headache, and muscle aches. Read and follow all instructions on the label. No one younger than 20 should take aspirin. It has been linked to Reye syndrome, a serious illness. Before you use over the counter cough and cold medicines, check the label. These medicines may not be safe for children younger than age 22 or for people with certain health problems. If the skin around your nose and lips becomes sore, put some petroleum jelly on the area.  Avoid spreading a cold: Wash your hands regularly, and keep your hands away from your face.  Stay home from school, work, and other public places until you are feeling better and your fever has been gone for at least 24 hours. The fever needs to have gone away on its own without the help of medicine.

## 2018-01-13 NOTE — ED Triage Notes (Signed)
Pt sts URI sx starting last night

## 2018-01-13 NOTE — ED Provider Notes (Signed)
[facility]   [id number] 01/13/18 Arrival Time: 1356  ASSESSMENT & PLAN:  1. Viral upper respiratory tract infection   2. Screening for STD (sexually transmitted disease) - requests    Meds ordered this encounter  Medications  . ibuprofen (ADVIL,MOTRIN) 800 MG tablet    Sig: Take 1 tablet (800 mg total) by mouth 3 (three) times daily.    Dispense:  21 tablet    Refill:  0   Discussed typical duration of symptoms. OTC symptom care as needed. Ensure adequate fluid intake and rest. May f/u with PCP or here as needed.  Reviewed expectations re: course of current medical issues. Questions answered. Outlined signs and symptoms indicating need for more acute intervention. Patient verbalized understanding. After Visit Summary given.   SUBJECTIVE: History from: patient.  Gary Ware is a 32 y.o. male who presents with complaint of nasal congestion, post-nasal drainage, and a persistent dry cough. Onset abrupt, approximately 1 day ago. Overall fatigued with body aches. SOB: none. Wheezing: none. Fever: no. Overall normal PO intake without n/v. Sick contacts: no. OTC treatment: none. No specific aggravating or alleviating factors reported.  Also requests STD screening. No symptoms.  Social History   Tobacco Use  Smoking Status Current Every Day Smoker  Smokeless Tobacco Never Used    ROS: As per HPI.   OBJECTIVE:  Vitals:   01/13/18 1416  BP: (!) 96/40  Pulse: 81  Resp: 18  Temp: 98 F (36.7 C)  TempSrc: Oral  SpO2: 98%     General appearance: alert; appears fatigued HEENT: nasal congestion; clear runny nose; throat irritation secondary to post-nasal drainage Neck: supple without LAD Lungs: unlabored respirations, symmetrical air entry; cough: mild; no respiratory distress Skin: warm and dry Psychological: alert and cooperative; normal mood and affect    No Known Allergies  Past Medical History:  Diagnosis Date  . Tobacco use    Family  History  Problem Relation Age of Onset  . Hypertension Maternal Grandmother    Social History   Socioeconomic History  . Marital status: Single    Spouse name: Not on file  . Number of children: Not on file  . Years of education: Not on file  . Highest education level: Not on file  Occupational History  . Not on file  Social Needs  . Financial resource strain: Not on file  . Food insecurity:    Worry: Not on file    Inability: Not on file  . Transportation needs:    Medical: Not on file    Non-medical: Not on file  Tobacco Use  . Smoking status: Current Every Day Smoker  . Smokeless tobacco: Never Used  Substance and Sexual Activity  . Alcohol use: Yes  . Drug use: No  . Sexual activity: Not on file  Lifestyle  . Physical activity:    Days per week: Not on file    Minutes per session: Not on file  . Stress: Not on file  Relationships  . Social connections:    Talks on phone: Not on file    Gets together: Not on file    Attends religious service: Not on file    Active member of club or organization: Not on file    Attends meetings of clubs or organizations: Not on file    Relationship status: Not on file  . Intimate partner violence:    Fear of current or ex partner: Not on file    Emotionally abused: Not on  file    Physically abused: Not on file    Forced sexual activity: Not on file  Other Topics Concern  . Not on file  Social History Narrative  . Not on file           Mardella Layman, MD 01/13/18 [phone]

## 2018-01-14 LAB — HIV ANTIBODY (ROUTINE TESTING W REFLEX): HIV Screen 4th Generation wRfx: NONREACTIVE

## 2018-01-14 LAB — URINE CYTOLOGY ANCILLARY ONLY
CHLAMYDIA, DNA PROBE: NEGATIVE
Neisseria Gonorrhea: NEGATIVE
Trichomonas: NEGATIVE

## 2018-01-14 LAB — RPR: RPR Ser Ql: NONREACTIVE

## 2018-05-02 ENCOUNTER — Emergency Department (HOSPITAL_COMMUNITY): Payer: Managed Care, Other (non HMO)

## 2018-05-02 ENCOUNTER — Other Ambulatory Visit: Payer: Self-pay

## 2018-05-02 ENCOUNTER — Emergency Department (HOSPITAL_COMMUNITY)
Admission: EM | Admit: 2018-05-02 | Discharge: 2018-05-02 | Disposition: A | Discharge status: Home / Self Care | Payer: Managed Care, Other (non HMO) | Attending: Emergency Medicine | Admitting: Emergency Medicine

## 2018-05-02 DIAGNOSIS — Z79899 Other long term (current) drug therapy: Secondary | ICD-10-CM | POA: Insufficient documentation

## 2018-05-02 DIAGNOSIS — Y9289 Other specified places as the place of occurrence of the external cause: Secondary | ICD-10-CM | POA: Diagnosis not present

## 2018-05-02 DIAGNOSIS — S0990XA Unspecified injury of head, initial encounter: Secondary | ICD-10-CM | POA: Diagnosis not present

## 2018-05-02 DIAGNOSIS — Y999 Unspecified external cause status: Secondary | ICD-10-CM | POA: Diagnosis not present

## 2018-05-02 DIAGNOSIS — F17210 Nicotine dependence, cigarettes, uncomplicated: Secondary | ICD-10-CM | POA: Diagnosis not present

## 2018-05-02 DIAGNOSIS — Y939 Activity, unspecified: Secondary | ICD-10-CM | POA: Diagnosis not present

## 2018-05-02 DIAGNOSIS — R51 Headache: Secondary | ICD-10-CM | POA: Diagnosis not present
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
## 2018-05-02 NOTE — ED Notes (Signed)
Patient transported to CT 
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
## 2018-05-02 NOTE — Discharge Instructions (Signed)
Imaging today was negative, no fractures in your face. Can take tylenol or motrin for pain. Follow-up with your primary care doctor. Return here for new concerns.

## 2018-08-29 ENCOUNTER — Emergency Department (HOSPITAL_COMMUNITY): Payer: Managed Care, Other (non HMO)

## 2018-08-29 ENCOUNTER — Encounter (HOSPITAL_COMMUNITY): Payer: Self-pay | Admitting: Obstetrics and Gynecology

## 2018-08-29 ENCOUNTER — Other Ambulatory Visit: Payer: Self-pay

## 2018-08-29 DIAGNOSIS — Z79899 Other long term (current) drug therapy: Secondary | ICD-10-CM | POA: Diagnosis not present

## 2018-08-29 DIAGNOSIS — F17290 Nicotine dependence, other tobacco product, uncomplicated: Secondary | ICD-10-CM | POA: Diagnosis not present

## 2018-08-29 DIAGNOSIS — J111 Influenza due to unidentified influenza virus with other respiratory manifestations: Secondary | ICD-10-CM | POA: Insufficient documentation

## 2018-08-29 DIAGNOSIS — R05 Cough: Secondary | ICD-10-CM | POA: Diagnosis present
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
## 2018-08-30 ENCOUNTER — Emergency Department (HOSPITAL_COMMUNITY)
Admission: EM | Admit: 2018-08-30 | Discharge: 2018-08-30 | Disposition: A | Discharge status: Home / Self Care | Payer: Managed Care, Other (non HMO) | Attending: Emergency Medicine | Admitting: Emergency Medicine

## 2018-08-30 DIAGNOSIS — R69 Illness, unspecified: Secondary | ICD-10-CM

## 2018-08-30 DIAGNOSIS — J111 Influenza due to unidentified influenza virus with other respiratory manifestations: Secondary | ICD-10-CM

## 2018-08-30 MED ORDER — OSELTAMIVIR PHOSPHATE 75 MG PO CAPS: 75.0000 mg | ORAL_CAPSULE | Freq: Two times a day (BID) | ORAL | 0 refills | Status: DC

## 2018-08-30 MED ORDER — IBUPROFEN 200 MG PO TABS
600.0000 mg | ORAL_TABLET | Freq: Once | ORAL | Status: AC
  Administered 2018-08-30: 600 mg via ORAL
  Filled 2018-08-30: qty 3

## 2018-08-30 NOTE — Discharge Instructions (Addendum)
It is important to stay hydrated so drink plenty of fluids - water, juice, limited soda. Take ibuprofen for aches and any fever. Take Tamiflu as directed until gone.

## 2018-08-30 NOTE — ED Provider Notes (Signed)
[facility]-EMERGENCY DEPT Provider Note   CSN: [id number] Arrival date & time: 08/29/18  2155     History   Chief Complaint Chief Complaint  Patient presents with  . Cough    HPI Gary Ware is a 33 y.o. male.  Patient to ED with sudden onset symptoms of cough, congestion, generalized body aches and fever. No nausea or vomiting. He has not taken anything prior to arrival for symptoms. No history of asthma. No SOB or chest pain.   The history is provided by the patient and a parent. No language interpreter was used.  Cough  Associated symptoms: chills, fever and myalgias   Associated symptoms: no chest pain, no rash, no shortness of breath and no sore throat     Past Medical History:  Diagnosis Date  . Tobacco use     There are no active problems to display for this patient.   History reviewed. No pertinent surgical history.      Home Medications    Prior to Admission medications   Medication Sig Start Date End Date Taking? Authorizing Provider  ibuprofen (ADVIL,MOTRIN) 800 MG tablet Take 1 tablet (800 mg total) by mouth 3 (three) times daily. Patient not taking: Reported on 05/02/2018 01/13/18   Mardella Layman, MD  naproxen (NAPROSYN) 500 MG tablet Take 1 tablet (500 mg total) by mouth 2 (two) times daily. Patient not taking: Reported on 05/02/2018 11/01/17   Ofilia Neas, PA-C    Family History Family History  Problem Relation Age of Onset  . Hypertension Maternal Grandmother     Social History Social History   Tobacco Use  . Smoking status: Current Every Day Smoker    Types: Cigars  . Smokeless tobacco: Never Used  . Tobacco comment: 2-3 a day  Substance Use Topics  . Alcohol use: Yes    Alcohol/week: 6.0 standard drinks    Types: 6 Standard drinks or equivalent per week  . Drug use: No     Allergies   Patient has no known allergies.   Review of Systems Review of Systems  Constitutional: Positive for appetite  change, chills and fever.  HENT: Positive for congestion. Negative for sore throat and trouble swallowing.   Respiratory: Positive for cough. Negative for shortness of breath.   Cardiovascular: Negative for chest pain.  Gastrointestinal: Negative for diarrhea, nausea and vomiting.  Musculoskeletal: Positive for myalgias. Negative for neck stiffness.  Skin: Negative for rash.  Neurological: Negative for syncope and weakness.     Physical Exam Updated Vital Signs BP 118/70 (BP Location: Left Arm)   Pulse 90   Temp 100 F (37.8 C) (Oral)   Resp 18   Wt 58 kg   SpO2 97%   BMI 18.87 kg/m   Physical Exam Constitutional:      Appearance: He is well-developed.  HENT:     Head: Normocephalic.     Nose: Congestion present.     Mouth/Throat:     Mouth: Mucous membranes are moist.  Neck:     Musculoskeletal: Normal range of motion and neck supple.  Cardiovascular:     Rate and Rhythm: Normal rate and regular rhythm.     Heart sounds: No murmur.  Pulmonary:     Effort: Pulmonary effort is normal.     Breath sounds: Normal breath sounds. No wheezing, rhonchi or rales.  Abdominal:     General: Bowel sounds are normal.     Palpations: Abdomen is soft.  Tenderness: There is no abdominal tenderness. There is no guarding or rebound.  Musculoskeletal: Normal range of motion.  Skin:    General: Skin is warm and dry.     Findings: No rash.  Neurological:     Mental Status: He is alert and oriented to person, place, and time.      ED Treatments / Results  Labs (all labs ordered are listed, but only abnormal results are displayed) Labs Reviewed - No data to display  EKG None  Radiology Dg Chest 2 View  Result Date: 08/29/2018 CLINICAL DATA:  Cough, shortness of breath, and chest discomfort since 4 a.m. today. Current smoker. EXAM: CHEST - 2 VIEW COMPARISON:  10/08/2017 FINDINGS: Mild hyperinflation. Central interstitial pattern with peribronchial thickening likely chronic  bronchitis. Normal heart size and pulmonary vascularity. No focal airspace disease or consolidation in the lungs. No blunting of costophrenic angles. No pneumothorax. Mediastinal contours appear intact. IMPRESSION: Chronic bronchitic changes in the lungs. No evidence of active pulmonary disease. Electronically Signed   By: Burman Nieves M.D.   On: 08/29/2018 23:13    Procedures Procedures (including critical care time)  Medications Ordered in ED Medications - No data to display   Initial Impression / Assessment and Plan / ED Course  I have reviewed the triage vital signs and the nursing notes.  Pertinent labs & imaging results that were available during my care of the patient were reviewed by me and considered in my medical decision making (see chart for details).     Patient to ED with fever/chills, cough, significant generalized body aches that started suddenly yesterday.   He is nontoxic in appearance. VSS, low grade temp. CXR clear. Discussed likely viral etiology, possible flu. Discussed pros and cons of Tamiflu and patient opts to get the prescription. Recommended ibuprofen for aches and any fever. Push fluids.   Final Clinical Impressions(s) / ED Diagnoses   Final diagnoses:  None   1. Flu like illness  ED Discharge Orders    None       Elpidio Anis, PA-C 08/30/18 0243    Zadie Rhine, MD 08/30/18 [phone]

## 2018-09-04 ENCOUNTER — Emergency Department (HOSPITAL_COMMUNITY)
Admission: EM | Admit: 2018-09-04 | Discharge: 2018-09-04 | Disposition: A | Discharge status: Home / Self Care | Payer: Managed Care, Other (non HMO) | Attending: Emergency Medicine | Admitting: Emergency Medicine

## 2018-09-04 ENCOUNTER — Encounter (HOSPITAL_COMMUNITY): Payer: Self-pay | Admitting: Emergency Medicine

## 2018-09-04 DIAGNOSIS — J069 Acute upper respiratory infection, unspecified: Secondary | ICD-10-CM | POA: Insufficient documentation

## 2018-09-04 DIAGNOSIS — B9789 Other viral agents as the cause of diseases classified elsewhere: Secondary | ICD-10-CM

## 2018-09-04 DIAGNOSIS — F17290 Nicotine dependence, other tobacco product, uncomplicated: Secondary | ICD-10-CM | POA: Insufficient documentation

## 2018-09-04 DIAGNOSIS — R05 Cough: Secondary | ICD-10-CM | POA: Diagnosis present

## 2018-09-04 MED ORDER — GUAIFENESIN ER 1200 MG PO TB12: 1.0000 | ORAL_TABLET | Freq: Two times a day (BID) | ORAL | 0 refills | Status: DC

## 2018-09-04 MED ORDER — PROMETHAZINE-DM 6.25-15 MG/5ML PO SYRP: 5.0000 mL | ORAL_SOLUTION | Freq: Four times a day (QID) | ORAL | 0 refills | Status: DC | PRN

## 2018-09-04 MED ORDER — IBUPROFEN 800 MG PO TABS: 800.0000 mg | ORAL_TABLET | Freq: Three times a day (TID) | ORAL | 0 refills | Status: DC | PRN

## 2018-09-04 NOTE — Discharge Instructions (Addendum)
Return here as needed.  Follow-up with a primary doctor.  Increase your fluid intake and rest as much as possible. °

## 2018-09-04 NOTE — ED Provider Notes (Signed)
[facility] EMERGENCY DEPARTMENT Provider Note   CSN: [id number] Arrival date & time: 09/04/18  0277     History   Chief Complaint Chief Complaint  Patient presents with  . Cough    HPI Gary Ware is a 33 y.o. male.  HPI Patient presents to the emergency department with continued upper respiratory symptoms.  The patient states he was here on Sunday and was given a work note until yesterday he states that he went to go back to work yesterday and was able to make it to several hours but then had to leave because he was feeling bad.  The patient states that he feels like he is improving but he feels like after working yesterday that his symptoms may not be totally resolving because of that.  Patient states that he did not take any other medications other than what was prescribed.  The patient states that nothing seems to make the condition better.  The patient denies chest pain, shortness of breath, headache,blurred vision, neck pain, fever,  weakness, numbness, dizziness, anorexia, edema, abdominal pain, nausea, vomiting, diarrhea, rash, back pain, dysuria, hematemesis, bloody stool, near syncope, or syncope. Past Medical History:  Diagnosis Date  . Tobacco use     There are no active problems to display for this patient.   History reviewed. No pertinent surgical history.      Home Medications    Prior to Admission medications   Medication Sig Start Date End Date Taking? Authorizing Provider  ibuprofen (ADVIL,MOTRIN) 800 MG tablet Take 1 tablet (800 mg total) by mouth 3 (three) times daily. Patient not taking: Reported on 05/02/2018 01/13/18   Mardella Layman, MD  naproxen (NAPROSYN) 500 MG tablet Take 1 tablet (500 mg total) by mouth 2 (two) times daily. Patient not taking: Reported on 05/02/2018 11/01/17   Ofilia Neas, PA-C  oseltamivir (TAMIFLU) 75 MG capsule Take 1 capsule (75 mg total) by mouth every 12 (twelve) hours. 08/30/18   Elpidio Anis, PA-C     Family History Family History  Problem Relation Age of Onset  . Hypertension Maternal Grandmother     Social History Social History   Tobacco Use  . Smoking status: Current Every Day Smoker    Types: Cigars  . Smokeless tobacco: Never Used  . Tobacco comment: 2-3 a day  Substance Use Topics  . Alcohol use: Yes    Alcohol/week: 6.0 standard drinks    Types: 6 Standard drinks or equivalent per week  . Drug use: No     Allergies   Patient has no known allergies.   Review of Systems Review of Systems  All other systems negative except as documented in the HPI. All pertinent positives and negatives as reviewed in the HPI. Physical Exam Updated Vital Signs BP (!) 144/84 (BP Location: Right Arm)   Pulse 72   Temp 98 F (36.7 C) (Oral)   Resp 16   SpO2 100%   Physical Exam Vitals signs and nursing note reviewed.  Constitutional:      General: He is not in acute distress.    Appearance: He is well-developed.  HENT:     Head: Normocephalic and atraumatic.     Right Ear: Tympanic membrane normal.     Left Ear: Tympanic membrane normal.     Nose: Nose normal.     Mouth/Throat:     Mouth: Mucous membranes are moist.     Pharynx: No oropharyngeal exudate or posterior oropharyngeal erythema.  Eyes:  Pupils: Pupils are equal, round, and reactive to light.  Neck:     Musculoskeletal: Normal range of motion and neck supple.  Cardiovascular:     Rate and Rhythm: Normal rate and regular rhythm.     Heart sounds: Normal heart sounds. No murmur. No friction rub. No gallop.   Pulmonary:     Effort: Pulmonary effort is normal. No respiratory distress.     Breath sounds: Normal breath sounds. No stridor. No wheezing, rhonchi or rales.  Skin:    General: Skin is warm and dry.     Capillary Refill: Capillary refill takes less than 2 seconds.     Findings: No erythema or rash.  Neurological:     Mental Status: He is alert and oriented to person, place, and time.      Motor: No abnormal muscle tone.     Coordination: Coordination normal.  Psychiatric:        Behavior: Behavior normal.      ED Treatments / Results  Labs (all labs ordered are listed, but only abnormal results are displayed) Labs Reviewed - No data to display  EKG None  Radiology No results found.  Procedures Procedures (including critical care time)  Medications Ordered in ED Medications - No data to display   Initial Impression / Assessment and Plan / ED Course  I have reviewed the triage vital signs and the nursing notes.  Pertinent labs & imaging results that were available during my care of the patient were reviewed by me and considered in my medical decision making (see chart for details).    Patient will be given symptomatic treatment. Patient is well appearing. Patient will be given a work note.  Patient reports having improved symptoms and is not ill-appearing at this time.   Final Clinical Impressions(s) / ED Diagnoses   Final diagnoses:  None    ED Discharge Orders    None       Charlestine Night, Cordelia Poche 09/04/18 0809    Tilden Fossa, MD 09/04/18 1154

## 2018-09-04 NOTE — ED Notes (Signed)
Patient verbalized understanding of dc instructions, vss, ambulatory upon discharge.

## 2018-09-04 NOTE — ED Triage Notes (Signed)
Patient c/o productive cough and congestion for a week, denies fevers/chills.

## 2018-12-09 ENCOUNTER — Emergency Department (HOSPITAL_COMMUNITY): Payer: Managed Care, Other (non HMO)

## 2018-12-09 ENCOUNTER — Encounter (HOSPITAL_COMMUNITY): Payer: Self-pay

## 2018-12-09 ENCOUNTER — Emergency Department (HOSPITAL_COMMUNITY)
Admission: EM | Admit: 2018-12-09 | Discharge: 2018-12-09 | Disposition: A | Discharge status: Home / Self Care | Payer: Managed Care, Other (non HMO) | Attending: Emergency Medicine | Admitting: Emergency Medicine

## 2018-12-09 ENCOUNTER — Other Ambulatory Visit: Payer: Self-pay

## 2018-12-09 DIAGNOSIS — M25522 Pain in left elbow: Secondary | ICD-10-CM

## 2018-12-09 DIAGNOSIS — F17290 Nicotine dependence, other tobacco product, uncomplicated: Secondary | ICD-10-CM | POA: Diagnosis not present

## 2018-12-09 MED ORDER — NAPROXEN 500 MG PO TABS: 500.0000 mg | ORAL_TABLET | Freq: Two times a day (BID) | ORAL | 0 refills | Status: DC

## 2018-12-09 MED ORDER — NAPROXEN 250 MG PO TABS
500.0000 mg | ORAL_TABLET | Freq: Once | ORAL | Status: AC
  Administered 2018-12-09: 500 mg via ORAL
  Filled 2018-12-09: qty 2

## 2018-12-09 NOTE — ED Triage Notes (Signed)
Pt reports left elbow pain for the past 2 weeks but noticed swelling this morning when he woke up. No swelling at this time, denies injury or trauma. No pain at this time

## 2018-12-09 NOTE — ED Provider Notes (Signed)
[facility]   CSN: [id number] Arrival date & time: 12/09/18  2127    History   Chief Complaint Chief Complaint  Patient presents with  . Elbow Pain    HPI Chaysen D Broadus JohnWarren is a 33 y.o. male.     Patient is a 33 year old male, right-hand-dominant, with no significant past medical history presents the emergency department for left elbow pain.  Patient reports that this waxes and wanes for the last 2 weeks.  Reports that it got worse over the last couple of days and this morning he woke up and he felt like his elbow was swollen.  Reports that the swelling has now gone away but he still has some pain.  The pain is worse with movement.  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  Denies any known injury or trauma.  Denies weakness in the arm.  Denies any fever, chills, numbness, tingling     Past Medical History:  Diagnosis Date  . Tobacco use     There are no active problems to display for this patient.   History reviewed. No pertinent surgical history.      Home Medications    Prior to Admission medications   Medication Sig Start Date End Date Taking? Authorizing Provider  Guaifenesin 1200 MG TB12 Take 1 tablet (1,200 mg total) by mouth 2 (two) times daily. 09/04/18   Lawyer, Cristal Deerhristopher, PA-C  ibuprofen (ADVIL,MOTRIN) 800 MG tablet Take 1 tablet (800 mg total) by mouth every 8 (eight) hours as needed. 09/04/18   Lawyer, Cristal Deerhristopher, PA-C  naproxen (NAPROSYN) 500 MG tablet Take 1 tablet (500 mg total) by mouth 2 (two) times daily. 12/09/18   Arlyn DunningMcLean, Ulonda Klosowski A, PA-C  oseltamivir (TAMIFLU) 75 MG capsule Take 1 capsule (75 mg total) by mouth every 12 (twelve) hours. 08/30/18   Elpidio AnisUpstill, Shari, PA-C  promethazine-dextromethorphan (PROMETHAZINE-DM) 6.25-15 MG/5ML syrup Take 5-10 mLs by mouth 4 (four) times daily as needed for cough. 09/04/18   Charlestine NightLawyer, Christopher, PA-C    Family History Family History  Problem Relation Age of  Onset  . Hypertension Maternal Grandmother     Social History Social History   Tobacco Use  . Smoking status: Current Every Day Smoker    Types: Cigars  . Smokeless tobacco: Never Used  . Tobacco comment: 2-3 a day  Substance Use Topics  . Alcohol use: Yes    Alcohol/week: 6.0 standard drinks    Types: 6 Standard drinks or equivalent per week  . Drug use: No     Allergies   Patient has no known allergies.   Review of Systems Review of Systems  Constitutional: Negative for chills and fever.  HENT: Negative for ear pain and sore throat.   Eyes: Negative for pain and visual disturbance.  Respiratory: Negative for cough and shortness of breath.   Cardiovascular: Negative for chest pain and palpitations.  Gastrointestinal: Negative for abdominal pain and vomiting.  Genitourinary: Negative for dysuria and hematuria.  Musculoskeletal: Positive for arthralgias and joint swelling. Negative for back pain, gait problem, myalgias, neck pain and neck stiffness.  Skin: Negative for color change and rash.  Neurological: Negative for seizures, syncope and numbness.  All other systems reviewed and are negative.    Physical Exam Updated Vital Signs BP 134/68   Pulse 69   Temp 98.3 F (36.8 C) (Oral)   Resp 14   SpO2 99%   Physical Exam Vitals signs and nursing note reviewed.  Constitutional:  Appearance: Normal appearance.  HENT:     Head: Normocephalic.  Eyes:     Conjunctiva/sclera: Conjunctivae normal.  Pulmonary:     Effort: Pulmonary effort is normal.  Musculoskeletal:     Comments: Patient has completely normal range of motion of the right and left upper extremity.  No skin changes, joint swelling, signs of injury or trauma.  He does have some very mild tenderness to the olecranon on the left side.  Skin:    General: Skin is dry.  Neurological:     Mental Status: He is alert.     Motor: No weakness.  Psychiatric:        Mood and Affect: Mood normal.       ED Treatments / Results  Labs (all labs ordered are listed, but only abnormal results are displayed) Labs Reviewed - No data to display  EKG None  Radiology Dg Elbow 2 Views Left  Result Date: 12/09/2018 CLINICAL DATA:  Elbow pain, swelling EXAM: LEFT ELBOW - 2 VIEW COMPARISON:  None. FINDINGS: No fracture or malalignment.  No significant elbow effusion. IMPRESSION: Negative. Electronically Signed   By: Jasmine Pang M.D.   On: 12/09/2018 22:01    Procedures Procedures (including critical care time)  Medications Ordered in ED Medications  naproxen (NAPROSYN) tablet 500 mg (500 mg Oral Given 12/09/18 2230)     Initial Impression / Assessment and Plan / ED Course  I have reviewed the triage vital signs and the nursing notes.  Pertinent labs & imaging results that were available during my care of the patient were reviewed by me and considered in my medical decision making (see chart for details).          Final Clinical Impressions(s) / ED Diagnoses   Final diagnoses:  Left elbow pain    ED Discharge Orders         Ordered    naproxen (NAPROSYN) 500 MG tablet  2 times daily     12/09/18 2227           Jeral Pinch 12/10/18 1700    Raeford Razor, MD 12/10/18 2022

## 2018-12-09 NOTE — Discharge Instructions (Signed)
Thank you for allowing me to care for you today. Please return to the emergency department if you have new or worsening symptoms. Take your medications as instructed.  ° °

## 2018-12-09 NOTE — ED Notes (Signed)
Patient transported to X-ray 

## 2018-12-16 ENCOUNTER — Other Ambulatory Visit: Payer: Self-pay

## 2018-12-16 ENCOUNTER — Encounter (HOSPITAL_COMMUNITY): Payer: Self-pay

## 2018-12-16 ENCOUNTER — Emergency Department (HOSPITAL_COMMUNITY)
Admission: EM | Admit: 2018-12-16 | Discharge: 2018-12-16 | Disposition: A | Discharge status: Home / Self Care | Payer: Managed Care, Other (non HMO) | Attending: Emergency Medicine | Admitting: Emergency Medicine

## 2018-12-16 DIAGNOSIS — M25532 Pain in left wrist: Secondary | ICD-10-CM | POA: Diagnosis present

## 2018-12-16 DIAGNOSIS — F17290 Nicotine dependence, other tobacco product, uncomplicated: Secondary | ICD-10-CM | POA: Insufficient documentation

## 2018-12-16 MED ORDER — IBUPROFEN 800 MG PO TABS: 800.0000 mg | ORAL_TABLET | Freq: Three times a day (TID) | ORAL | 0 refills | Status: DC

## 2018-12-16 NOTE — ED Provider Notes (Signed)
[facility] EMERGENCY DEPARTMENT Provider Note   CSN: [id number] Arrival date & time: 12/16/18  2148    History   Chief Complaint Chief Complaint  Patient presents with  . Wrist Pain    HPI Gary Ware is a 33 y.o. male.     Patient presents to the emergency department with a chief complaint of left wrist pain.  He reports having symptoms for the past 3 weeks.  He denies any known injury.  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  He denies any aggravating factors.  States that he has had some relief with ibuprofen.  Reports that his family has several members who have carpal tunnel syndrome.  Denies any numbness, weakness, or tingling into his hand.  States that he was seen recently and had some pain in his left elbow.  The history is provided by the patient. No language interpreter was used.    Past Medical History:  Diagnosis Date  . Tobacco use     There are no active problems to display for this patient.   History reviewed. No pertinent surgical history.      Home Medications    Prior to Admission medications   Medication Sig Start Date End Date Taking? Authorizing Provider  Guaifenesin 1200 MG TB12 Take 1 tablet (1,200 mg total) by mouth 2 (two) times daily. 09/04/18   Lawyer, Cristal Deer, PA-C  ibuprofen (ADVIL,MOTRIN) 800 MG tablet Take 1 tablet (800 mg total) by mouth every 8 (eight) hours as needed. 09/04/18   Lawyer, Cristal Deer, PA-C  naproxen (NAPROSYN) 500 MG tablet Take 1 tablet (500 mg total) by mouth 2 (two) times daily. 12/09/18   Arlyn Dunning, PA-C  oseltamivir (TAMIFLU) 75 MG capsule Take 1 capsule (75 mg total) by mouth every 12 (twelve) hours. 08/30/18   Elpidio Anis, PA-C  promethazine-dextromethorphan (PROMETHAZINE-DM) 6.25-15 MG/5ML syrup Take 5-10 mLs by mouth 4 (four) times daily as needed for cough. 09/04/18   Charlestine Night, PA-C    Family History  Family History  Problem Relation Age of Onset  . Hypertension Maternal Grandmother     Social History Social History   Tobacco Use  . Smoking status: Current Every Day Smoker    Types: Cigars  . Smokeless tobacco: Never Used  . Tobacco comment: 2-3 a day  Substance Use Topics  . Alcohol use: Yes    Alcohol/week: 6.0 standard drinks    Types: 6 Standard drinks or equivalent per week  . Drug use: No     Allergies   Patient has no known allergies.   Review of Systems Review of Systems  All other systems reviewed and are negative.    Physical Exam Updated Vital Signs BP 128/77 (BP Location: Right Arm)   Pulse 69   Temp 98.6 F (37 C) (Oral)   Resp 12   SpO2 98%   Physical Exam  Nursing note and vitals reviewed.  Constitutional: Pt appears well-developed and well-nourished. No distress.  HENT:  Head: Normocephalic and atraumatic.  Eyes: Conjunctivae are normal.  Neck: Normal range of motion.  Cardiovascular: Normal rate, regular rhythm. Intact distal pulses.   Capillary refill < 3 sec.  Pulmonary/Chest: Effort normal and breath sounds normal.  Musculoskeletal:  LUE Pt exhibits no TTP, no bony deformity or abnormality, POSITIVE TINEL.   ROM: 5/5  Strength: 5/5  Neurological: Pt  is alert. Coordination normal.  Sensation: 5/5 Skin: Skin is  warm and dry. Pt is not diaphoretic.  No evidence of open wound or skin tenting Psychiatric: Pt has a normal mood and affect.    ED Treatments / Results  Labs (all labs ordered are listed, but only abnormal results are displayed) Labs Reviewed - No data to display  EKG None  Radiology No results found.  Procedures Procedures (including critical care time)  Medications Ordered in ED Medications - No data to display   Initial Impression / Assessment and Plan / ED Course  I have reviewed the triage vital signs and the nursing notes.  Pertinent labs & imaging results that were available during my care of  the patient were reviewed by me and considered in my medical decision making (see chart for details).        Patient with left wrist pain.  Nontraumatic.  No swelling or erythema.  Doubt septic joint.  Doubt gout.  Concern for carpal tunnel syndrome given the description of the symptoms.  He does have a positive Tinel test.  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  Will treat with wrist splint, NSAIDs, ice, and stretching.  Recommend hand follow-up if no improvement.  Final Clinical Impressions(s) / ED Diagnoses   Final diagnoses:  Left wrist pain    ED Discharge Orders         Ordered    ibuprofen (ADVIL) 800 MG tablet  3 times daily     12/16/18 2244           Roxy HorsemanBrowning, Anise Harbin, PA-C 12/16/18 2246    Vanetta MuldersZackowski, Scott, MD 12/21/18 [phone]

## 2018-12-16 NOTE — Discharge Instructions (Addendum)
Your symptoms seem consistent with carpal tunnel syndrome.  It seems that you have a mild case.  Please wear the wrist splint at night.  He may wear it during the daytime as well and at work if you would like, but most important is at night.  Take the medication as prescribed.  Drink plenty of water.  Take the medication with food.  Also try to stretch your wrist during the day and apply ice using ice massage as discussed.

## 2018-12-16 NOTE — ED Triage Notes (Signed)
Pt reports having L wrist pain 3x weeks from work. no report of trauma.

## 2019-04-12 ENCOUNTER — Emergency Department (HOSPITAL_COMMUNITY)
Admission: EM | Admit: 2019-04-12 | Discharge: 2019-04-13 | Disposition: A | Discharge status: Home / Self Care | Payer: Managed Care, Other (non HMO) | Attending: Emergency Medicine | Admitting: Emergency Medicine

## 2019-04-12 ENCOUNTER — Encounter (HOSPITAL_COMMUNITY): Payer: Self-pay | Admitting: Emergency Medicine

## 2019-04-12 ENCOUNTER — Other Ambulatory Visit: Payer: Self-pay

## 2019-04-12 DIAGNOSIS — F17200 Nicotine dependence, unspecified, uncomplicated: Secondary | ICD-10-CM | POA: Insufficient documentation

## 2019-04-12 DIAGNOSIS — B029 Zoster without complications: Secondary | ICD-10-CM | POA: Diagnosis not present

## 2019-04-12 DIAGNOSIS — R21 Rash and other nonspecific skin eruption: Secondary | ICD-10-CM | POA: Diagnosis present

## 2019-04-12 NOTE — ED Triage Notes (Signed)
Patient reports left eye itching with mild redness onset this morning , no vision loss/ denies injury.

## 2019-04-12 NOTE — ED Provider Notes (Signed)
[facility] EMERGENCY DEPARTMENT Provider Note   CSN: [id number] Arrival date & time: 04/12/19  2102     History   Chief Complaint Chief Complaint  Patient presents with  . Eye Itching    HPI Gary Ware is a 33 y.o. male presents to the emergency department with a chief complaint of eye itching.  The patient reports he developed a rash to the left eye that began to 2 days ago.  He was initially concerned that the symptoms were related to having squirted perfume in and around the left eye over the weekend.  He reports that he initially had some itching in the outside corner of the left eye that then spread to the entire left side of the eye, consistent with a rash.  He reports that the rash has been burning and painful since onset.  Yesterday, he began to notice some swelling around the left eye.  No recent fever, chills, malaise, headache, diplopia, blurred vision, matting of the eyes, otalgia, sore throat.   No treatment prior to arrival.  No known sick contacts.  He denies a history of diabetes mellitus or HIV and is otherwise not immunocompromise.  He reports that he had the chickenpox virus as a child.     The history is provided by the patient. No language interpreter was used.    Past Medical History:  Diagnosis Date  . Tobacco use     There are no active problems to display for this patient.   History reviewed. No pertinent surgical history.      Home Medications    Prior to Admission medications   Medication Sig Start Date End Date Taking? Authorizing Provider  Guaifenesin 1200 MG TB12 Take 1 tablet (1,200 mg total) by mouth 2 (two) times daily. 09/04/18   Lawyer, Cristal Deer, PA-C  hydrOXYzine (ATARAX/VISTARIL) 25 MG tablet Take 1 tablet (25 mg total) by mouth every 6 (six) hours. 04/13/19   Tyrell Brereton A, PA-C  ibuprofen (ADVIL) 800 MG tablet Take 1 tablet (800 mg total) by mouth 3 (three) times daily. 12/16/18   Roxy Horseman,  PA-C  oseltamivir (TAMIFLU) 75 MG capsule Take 1 capsule (75 mg total) by mouth every 12 (twelve) hours. 08/30/18   Elpidio Anis, PA-C  promethazine-dextromethorphan (PROMETHAZINE-DM) 6.25-15 MG/5ML syrup Take 5-10 mLs by mouth 4 (four) times daily as needed for cough. 09/04/18   Lawyer, Cristal Deer, PA-C  valACYclovir (VALTREX) 1000 MG tablet Take 1 tablet (1,000 mg total) by mouth 3 (three) times daily for 10 days. 04/13/19 04/23/19  Dorothye Berni, Coral Else, PA-C    Family History Family History  Problem Relation Age of Onset  . Hypertension Maternal Grandmother     Social History Social History   Tobacco Use  . Smoking status: Current Every Day Smoker    Types: Cigars  . Smokeless tobacco: Never Used  . Tobacco comment: 2-3 a day  Substance Use Topics  . Alcohol use: Yes    Alcohol/week: 6.0 standard drinks    Types: 6 Standard drinks or equivalent per week  . Drug use: No     Allergies   Patient has no known allergies.   Review of Systems Review of Systems  Constitutional: Negative for appetite change, chills and fever.  HENT: Positive for facial swelling.   Eyes: Positive for pain and itching.  Respiratory: Negative for shortness of breath.   Cardiovascular: Negative for chest pain.  Gastrointestinal: Negative for abdominal pain, diarrhea, nausea and vomiting.  Genitourinary: Negative  for dysuria.  Musculoskeletal: Negative for back pain.  Skin: Positive for rash. Negative for color change and wound.  Allergic/Immunologic: Negative for immunocompromised state.  Neurological: Negative for dizziness, seizures, weakness, numbness and headaches.  Psychiatric/Behavioral: Negative for confusion.   Physical Exam Updated Vital Signs BP 134/78 (BP Location: Right Arm)   Pulse 72   Temp 98.6 F (37 C) (Oral)   Resp 18   SpO2 100%   Physical Exam Vitals signs and nursing note reviewed.  Constitutional:      Appearance: He is well-developed.  HENT:     Head: Normocephalic.   Eyes:     General: Lids are normal.        Right eye: No foreign body, discharge or hordeolum.        Left eye: No foreign body, discharge or hordeolum.     Extraocular Movements: Extraocular movements intact.     Conjunctiva/sclera:     Right eye: Right conjunctiva is not injected. No chemosis, exudate or hemorrhage.    Left eye: Left conjunctiva is injected. No chemosis, exudate or hemorrhage.    Pupils: Pupils are equal, round, and reactive to light.     Left eye: Fluorescein uptake present. Seidel exam negative.    Funduscopic exam:    Right eye: No hemorrhage.        Left eye: No hemorrhage.     Comments: No dendritic lesions  Neck:     Musculoskeletal: Neck supple.  Cardiovascular:     Rate and Rhythm: Normal rate and regular rhythm.     Heart sounds: No murmur.  Pulmonary:     Effort: Pulmonary effort is normal.  Abdominal:     General: There is no distension.     Palpations: Abdomen is soft.  Skin:    General: Skin is warm and dry.     Comments: Grouped vesicular rash noted to the left periorbital skin.  No lesions noted to the bilateral peri-auricular skin, to the bilateral canals, or to the nose.  Neurological:     Mental Status: He is alert.  Psychiatric:        Behavior: Behavior normal.          ED Treatments / Results  Labs (all labs ordered are listed, but only abnormal results are displayed) Labs Reviewed - No data to display  EKG None  Radiology No results found.  Procedures Procedures (including critical care time)  Medications Ordered in ED Medications  fluorescein ophthalmic strip 1 strip (1 strip Both Eyes Given by Other 04/13/19 0021)  tetracaine (PONTOCAINE) 0.5 % ophthalmic solution 1 drop (1 drop Both Eyes Given by Other 04/13/19 0021)  valACYclovir (VALTREX) tablet 1,000 mg (1,000 mg Oral Given 04/13/19 0139)     Initial Impression / Assessment and Plan / ED Course  I have reviewed the triage vital signs and the nursing notes.   Pertinent labs & imaging results that were available during my care of the patient were reviewed by me and considered in my medical decision making (see chart for details).        33 year old male with no pertinent past medical history presenting to the ER with a grouped vesicular rash noted to the left periorbital skin for the last 2 to 3 days that is pruritic and painful.  On exam, rash is consistent with herpes zoster.  The patient did have chickenpox as a child.  He has no visual changes.  No headaches, seizure-like activity, or auricular complaints.  On exam,  no dendritic lesions are noted.  Patient was discussed with Dr. Preston FleetingGlick, attending physician.  Will initiate Valtrex, 1 g 3 times daily, for the next 10 days and ensure the patient has urgent follow-up with ophthalmology in the clinic tomorrow.  Message sent to on-call ophthalmologist through epic.  The patient has been given a referral in his discharge paperwork.  Will give hydroxyzine for pruritus.  Low suspicion for herpetic ophthalmicus or Ramsay Hunt syndrome at this time.  Strict return precautions to the ER have been given.  He has been advised to that rash is an infectious and he should use caution around immunocompromised individuals.  He is hemodynamically stable and in no acute distress.  Safe for discharge to home with outpatient follow-up.   Final Clinical Impressions(s) / ED Diagnoses   Final diagnoses:  Herpes zoster without complication    ED Discharge Orders         Ordered    valACYclovir (VALTREX) 1000 MG tablet  3 times daily     04/13/19 0117    hydrOXYzine (ATARAX/VISTARIL) 25 MG tablet  Every 6 hours     04/13/19 0117           Frederik PearMcDonald, Lilliana Turner A, PA-C 04/13/19 0452    Dione BoozeGlick, David, MD 04/13/19 [phone]

## 2019-04-13 MED ORDER — VALACYCLOVIR HCL 1 G PO TABS
1000.0000 mg | ORAL_TABLET | Freq: Three times a day (TID) | ORAL | 0 refills | Status: AC
Start: 2019-04-13 — End: 2019-04-23

## 2019-04-13 MED ORDER — TETRACAINE HCL 0.5 % OP SOLN
1.0000 [drp] | Freq: Once | OPHTHALMIC | Status: AC
  Administered 2019-04-13: 1 [drp] via OPHTHALMIC
  Filled 2019-04-13: qty 4

## 2019-04-13 MED ORDER — HYDROXYZINE HCL 25 MG PO TABS: 25.0000 mg | ORAL_TABLET | Freq: Four times a day (QID) | ORAL | 0 refills | Status: DC

## 2019-04-13 MED ORDER — VALACYCLOVIR HCL 500 MG PO TABS
1000.0000 mg | ORAL_TABLET | Freq: Once | ORAL | Status: AC
  Administered 2019-04-13: 1000 mg via ORAL
  Filled 2019-04-13: qty 2

## 2019-04-13 MED ORDER — FLUORESCEIN SODIUM 1 MG OP STRP
1.0000 | ORAL_STRIP | Freq: Once | OPHTHALMIC | Status: AC
  Administered 2019-04-13: 1 via OPHTHALMIC
  Filled 2019-04-13: qty 1

## 2019-04-13 NOTE — Discharge Instructions (Signed)
Thank you for allowing me to care for you today in the Emergency Department.   It is very important you call Dr. Trena Platt office tomorrow morning so that you can be seen in the office tomorrow.  I will also try to send him a message to let him know that you will be reaching out.  For shingles, take 1 tablet of valacyclovir every 8 hours for the next 10 days.  Your first dose was given tonight in the ER.  You can take 1 tablet of hydroxyzine every 6 hours as needed for itching.  You can also take Tylenol and ibuprofen once every 6 hours for pain.  Make sure that you take ibuprofen with food so it does not set your stomach.  You can apply a cool washcloth over the rash to help with pain.  It is important to let your employer know that you were diagnosed with herpes zoster as it can be contagious, especially once the rash starts to ooze.  Return to the emergency department if you develop seizure-like activity, new numbness or weakness, blurry or double vision or other changes to your vision, significant pain in your ear, severe headaches, or other new, concerning symptoms.

## 2019-04-13 NOTE — ED Notes (Signed)
Patient verbalizes understanding of discharge instructions. Opportunity for questioning and answers were provided. Armband removed by staff, pt discharged from ED ambulatory.   

## 2019-04-29 ENCOUNTER — Other Ambulatory Visit: Payer: Self-pay

## 2019-04-29 ENCOUNTER — Ambulatory Visit (HOSPITAL_COMMUNITY)
Admission: EM | Admit: 2019-04-29 | Discharge: 2019-04-29 | Disposition: A | Discharge status: Home / Self Care | Payer: Managed Care, Other (non HMO) | Attending: Emergency Medicine | Admitting: Emergency Medicine

## 2019-04-29 ENCOUNTER — Encounter (HOSPITAL_COMMUNITY): Payer: Self-pay

## 2019-04-29 DIAGNOSIS — M545 Low back pain, unspecified: Secondary | ICD-10-CM

## 2019-04-29 MED ORDER — IBUPROFEN 800 MG PO TABS
800.0000 mg | ORAL_TABLET | Freq: Three times a day (TID) | ORAL | 0 refills | Status: DC | PRN
Start: 2019-04-29 — End: 2019-12-12

## 2019-04-29 MED ORDER — CYCLOBENZAPRINE HCL 10 MG PO TABS: 10.0000 mg | ORAL_TABLET | Freq: Two times a day (BID) | ORAL | 0 refills | Status: DC | PRN

## 2019-04-29 NOTE — Discharge Instructions (Signed)
Take the prescribed ibuprofen as needed for your pain.  Take the muscle relaxer Flexeril as needed for muscle spasm; do not drive, operate machinery, or drink alcohol with this medication as it may make you drowsy.   ° °Return here or follow up with your primary care provider if your pain is not improving or gets worse; or if you develop new symptoms such as difficulty with urination, weakness, numbness, loss of control of your bladder or bowels, fever, or chills.   ° °

## 2019-04-29 NOTE — ED Triage Notes (Signed)
Patient reports started having lower back pain 2 days ago 04/27/2019, he is taking Ibuprofen and wearing a back brace.

## 2019-04-29 NOTE — ED Provider Notes (Signed)
[facility]    CSN: [id number] Arrival date & time: 04/29/19  [place]      History   Chief Complaint Chief Complaint  Patient presents with  . Back Pain    HPI Gary Ware is a 33 y.o. male.   Patient presents with right lower back pain x2 days.  He denies falls or injuries.  The pain is nonradiating; he rates it 2/10; is worse with bending and lifting and improves with rest.  He denies saddle anesthesia, bowel/bladder incontinence, weakness, abdominal pain, dysuria, penile discharge, testicular pain, or other symptoms.  The history is provided by the patient.    Past Medical History:  Diagnosis Date  . Tobacco use     There are no active problems to display for this patient.   History reviewed. No pertinent surgical history.     Home Medications    Prior to Admission medications   Medication Sig Start Date End Date Taking? Authorizing Provider  cyclobenzaprine (FLEXERIL) 10 MG tablet Take 1 tablet (10 mg total) by mouth 2 (two) times daily as needed for muscle spasms. 04/29/19   Sharion Balloon, NP  Guaifenesin 1200 MG TB12 Take 1 tablet (1,200 mg total) by mouth 2 (two) times daily. 09/04/18   Lawyer, Harrell Gave, PA-C  hydrOXYzine (ATARAX/VISTARIL) 25 MG tablet Take 1 tablet (25 mg total) by mouth every 6 (six) hours. 04/13/19   McDonald, Mia A, PA-C  ibuprofen (ADVIL) 800 MG tablet Take 1 tablet (800 mg total) by mouth every 8 (eight) hours as needed. 04/29/19   Sharion Balloon, NP  oseltamivir (TAMIFLU) 75 MG capsule Take 1 capsule (75 mg total) by mouth every 12 (twelve) hours. 08/30/18   Charlann Lange, PA-C  promethazine-dextromethorphan (PROMETHAZINE-DM) 6.25-15 MG/5ML syrup Take 5-10 mLs by mouth 4 (four) times daily as needed for cough. 09/04/18   Dalia Heading, PA-C    Family History Family History  Problem Relation Age of Onset  . Hypertension Maternal Grandmother     Social History Social History   Tobacco Use  . Smoking status: Current  Every Day Smoker    Types: Cigars  . Smokeless tobacco: Never Used  . Tobacco comment: 2-3 a day  Substance Use Topics  . Alcohol use: Yes    Alcohol/week: 6.0 standard drinks    Types: 6 Standard drinks or equivalent per week  . Drug use: No     Allergies   Patient has no known allergies.   Review of Systems Review of Systems  Constitutional: Negative for chills and fever.  HENT: Negative for ear pain and sore throat.   Eyes: Negative for pain and visual disturbance.  Respiratory: Negative for cough and shortness of breath.   Cardiovascular: Negative for chest pain and palpitations.  Gastrointestinal: Negative for abdominal pain and vomiting.  Genitourinary: Negative for discharge, dysuria, flank pain, hematuria and testicular pain.  Musculoskeletal: Positive for back pain. Negative for arthralgias.  Skin: Negative for color change and rash.  Neurological: Negative for seizures and syncope.  All other systems reviewed and are negative.    Physical Exam Triage Vital Signs ED Triage Vitals  Enc Vitals Group     BP 04/29/19 1846 131/84     Pulse Rate 04/29/19 1846 82     Resp 04/29/19 1846 14     Temp 04/29/19 1846 98.2 F (36.8 C)     Temp Source 04/29/19 1846 Oral     SpO2 04/29/19 1846 97 %     Weight --  Height --      Head Circumference --      Peak Flow --      Pain Score 04/29/19 1845 2     Pain Loc --      Pain Edu? --      Excl. in GC? --    No data found.  Updated Vital Signs BP 131/84 (BP Location: Left Arm)   Pulse 82   Temp 98.2 F (36.8 C) (Oral)   Resp 14   SpO2 97%   Visual Acuity Right Eye Distance:   Left Eye Distance:   Bilateral Distance:    Right Eye Near:   Left Eye Near:    Bilateral Near:     Physical Exam Vitals signs and nursing note reviewed.  Constitutional:      Appearance: He is well-developed.  HENT:     Head: Normocephalic and atraumatic.     Mouth/Throat:     Mouth: Mucous membranes are moist.      Pharynx: Oropharynx is clear.  Eyes:     Conjunctiva/sclera: Conjunctivae normal.  Neck:     Musculoskeletal: Neck supple.  Cardiovascular:     Rate and Rhythm: Normal rate and regular rhythm.     Heart sounds: No murmur.  Pulmonary:     Effort: Pulmonary effort is normal. No respiratory distress.     Breath sounds: Normal breath sounds.  Abdominal:     General: Bowel sounds are normal.     Palpations: Abdomen is soft.     Tenderness: There is no abdominal tenderness. There is no guarding or rebound.  Musculoskeletal: Normal range of motion.        General: No swelling, tenderness or deformity.  Skin:    General: Skin is warm and dry.     Capillary Refill: Capillary refill takes less than 2 seconds.     Findings: No bruising, erythema, lesion or rash.  Neurological:     General: No focal deficit present.     Mental Status: He is alert and oriented to person, place, and time.     Sensory: No sensory deficit.     Motor: No weakness.     Coordination: Coordination normal.     Gait: Gait normal.     Deep Tendon Reflexes: Reflexes normal.      UC Treatments / Results  Labs (all labs ordered are listed, but only abnormal results are displayed) Labs Reviewed - No data to display  EKG   Radiology No results found.  Procedures Procedures (including critical care time)  Medications Ordered in UC Medications - No data to display  Initial Impression / Assessment and Plan / UC Course  I have reviewed the triage vital signs and the nursing notes.  Pertinent labs & imaging results that were available during my care of the patient were reviewed by me and considered in my medical decision making (see chart for details).    Acute right lower back pain without sciatica.  Treating with ibuprofen and Flexeril.  Precautions for Flexeril given to patient including not to drive, operate machinery, or drink alcohol due to the possibility of drowsiness.  Instructed patient to return  here or follow-up with his PCP if his symptoms are not improving.  Instructed him to return here if he develops new symptoms such as dysuria, weakness, numbness, bowel or bladder incontinence, fever.  Patient agrees with plan of care.     Final Clinical Impressions(s) / UC Diagnoses   Final diagnoses:  Acute  right-sided low back pain without sciatica     Discharge Instructions     Take the prescribed ibuprofen as needed for your pain.  Take the muscle relaxer Flexeril as needed for muscle spasm; do not drive, operate machinery, or drink alcohol with this medication as it may make you drowsy.    Return here or follow up with your primary care provider if your pain is not improving or gets worse; or if you develop new symptoms such as difficulty with urination, weakness, numbness, loss of control of your bladder or bowels, fever, or chills.       ED Prescriptions    Medication Sig Dispense Auth. Provider   ibuprofen (ADVIL) 800 MG tablet Take 1 tablet (800 mg total) by mouth every 8 (eight) hours as needed. 21 tablet Mickie Bail, NP   cyclobenzaprine (FLEXERIL) 10 MG tablet Take 1 tablet (10 mg total) by mouth 2 (two) times daily as needed for muscle spasms. 20 tablet Mickie Bail, NP     Controlled Substance Prescriptions [place] Controlled Substance Registry consulted? Not Applicable   Mickie Bail, NP 04/29/19 [place] Moment

## 2019-05-05 ENCOUNTER — Encounter (HOSPITAL_COMMUNITY): Payer: Self-pay | Admitting: Emergency Medicine

## 2019-05-05 ENCOUNTER — Other Ambulatory Visit: Payer: Self-pay

## 2019-05-05 ENCOUNTER — Ambulatory Visit (HOSPITAL_COMMUNITY)
Admission: EM | Admit: 2019-05-05 | Discharge: 2019-05-05 | Disposition: A | Discharge status: Home / Self Care | Payer: Managed Care, Other (non HMO) | Attending: Urgent Care | Admitting: Urgent Care

## 2019-05-05 DIAGNOSIS — B349 Viral infection, unspecified: Secondary | ICD-10-CM | POA: Insufficient documentation

## 2019-05-05 DIAGNOSIS — R0981 Nasal congestion: Secondary | ICD-10-CM | POA: Diagnosis not present

## 2019-05-05 DIAGNOSIS — J069 Acute upper respiratory infection, unspecified: Secondary | ICD-10-CM | POA: Diagnosis not present

## 2019-05-05 DIAGNOSIS — Z20828 Contact with and (suspected) exposure to other viral communicable diseases: Secondary | ICD-10-CM | POA: Insufficient documentation

## 2019-05-05 DIAGNOSIS — F17210 Nicotine dependence, cigarettes, uncomplicated: Secondary | ICD-10-CM | POA: Insufficient documentation

## 2019-05-05 DIAGNOSIS — R000 Tachycardia, unspecified: Secondary | ICD-10-CM | POA: Insufficient documentation

## 2019-05-05 DIAGNOSIS — R05 Cough: Secondary | ICD-10-CM | POA: Insufficient documentation

## 2019-05-05 DIAGNOSIS — Z720 Tobacco use: Secondary | ICD-10-CM

## 2019-05-05 DIAGNOSIS — R059 Cough, unspecified: Secondary | ICD-10-CM

## 2019-05-05 MED ORDER — BENZONATATE 100 MG PO CAPS: 100.0000 mg | ORAL_CAPSULE | Freq: Three times a day (TID) | ORAL | 0 refills | Status: DC | PRN

## 2019-05-05 MED ORDER — PROMETHAZINE-DM 6.25-15 MG/5ML PO SYRP: 5.0000 mL | ORAL_SOLUTION | Freq: Every evening | ORAL | 0 refills | Status: DC | PRN

## 2019-05-05 NOTE — ED Triage Notes (Signed)
Pt c/o cough, states when he first started coughing it was with a sore throat. Also c/o congestion. For the last 2 days.

## 2019-05-05 NOTE — ED Provider Notes (Signed)
MRN: [id number] DOB: [birth year]  Subjective:   Gary Ware is a 33 y.o. male presenting for 2-day history of mild to moderate malaise, productive cough, nasal congestion. Patient is a smoker. Denies hx of asthma, COPD. Denies COVID 19 contacts. Works at [employer].   No current facility-administered medications for this encounter.   Current Outpatient Medications:  .  cyclobenzaprine (FLEXERIL) 10 MG tablet, Take 1 tablet (10 mg total) by mouth 2 (two) times daily as needed for muscle spasms., Disp: 20 tablet, Rfl: 0 .  hydrOXYzine (ATARAX/VISTARIL) 25 MG tablet, Take 1 tablet (25 mg total) by mouth every 6 (six) hours., Disp: 12 tablet, Rfl: 0 .  ibuprofen (ADVIL) 800 MG tablet, Take 1 tablet (800 mg total) by mouth every 8 (eight) hours as needed., Disp: 21 tablet, Rfl: 0    No Known Allergies   Past Medical History:  Diagnosis Date  . Tobacco use      History reviewed. No pertinent surgical history.  Review of Systems  Constitutional: Positive for malaise/fatigue. Negative for fever.  HENT: Positive for congestion. Negative for ear pain, sinus pain and sore throat.   Eyes: Negative for blurred vision, double vision, discharge and redness.  Respiratory: Positive for cough. Negative for hemoptysis, shortness of breath and wheezing.   Cardiovascular: Negative for chest pain.  Gastrointestinal: Negative for abdominal pain, diarrhea, nausea and vomiting.  Genitourinary: Negative for dysuria, flank pain and hematuria.  Musculoskeletal: Negative for myalgias.  Skin: Negative for rash.  Neurological: Negative for dizziness, weakness and headaches.  Psychiatric/Behavioral: Negative for depression and substance abuse.    Objective:   Vitals: BP 115/63   Pulse (!) 109   Temp 98.5 F (36.9 C)   SpO2 97%   Physical Exam Constitutional:      General: He is not in acute distress.    Appearance: Normal appearance. He is well-developed and normal weight. He is not  ill-appearing, toxic-appearing or diaphoretic.  HENT:     Head: Normocephalic and atraumatic.     Right Ear: Tympanic membrane, ear canal and external ear normal. There is no impacted cerumen.     Left Ear: Tympanic membrane, ear canal and external ear normal. There is no impacted cerumen.     Nose: Congestion and rhinorrhea present.     Mouth/Throat:     Mouth: Mucous membranes are moist.     Pharynx: Oropharynx is clear. No oropharyngeal exudate or posterior oropharyngeal erythema.  Eyes:     General: No scleral icterus.       Right eye: No discharge.        Left eye: No discharge.     Extraocular Movements: Extraocular movements intact.     Conjunctiva/sclera: Conjunctivae normal.     Pupils: Pupils are equal, round, and reactive to light.  Neck:     Musculoskeletal: Normal range of motion and neck supple. No neck rigidity or muscular tenderness.  Cardiovascular:     Rate and Rhythm: Regular rhythm. Tachycardia present.     Heart sounds: Normal heart sounds. No murmur. No friction rub. No gallop.   Pulmonary:     Effort: Pulmonary effort is normal. No respiratory distress.     Breath sounds: Normal breath sounds. No stridor. No wheezing, rhonchi or rales.  Neurological:     General: No focal deficit present.     Mental Status: He is alert and oriented to person, place, and time.  Psychiatric:        Mood and Affect:  Mood normal.        Behavior: Behavior normal.        Thought Content: Thought content normal.      Assessment and Plan :   1. Cough   2. Viral syndrome   3. Tobacco use     Will manage for viral illness including common cold, viral URI, possible COVID-19, allergic rhinitis; likely worsened by his smoking. Counseled patient on nature of COVID-19 including modes of transmission, diagnostic testing, management and supportive care.  Offered symptomatic relief. COVID 19 testing is pending.  He does have borderline tachycardia, pulse was 104 and recheck by PA Urban GibsonMani,  wife and patient monitor and follow-up with PCP.  Low suspicion for MI, PE, aortic dissection, acute cardiopulmonary event given minimal risk factors.  Counseled patient on potential for adverse effects with medications prescribed/recommended today, ER and return-to-clinic precautions discussed, patient verbalized understanding.     Wallis BambergMani, [name], PA-C 05/05/19 1728

## 2019-05-05 NOTE — Discharge Instructions (Signed)
We will manage this as a viral syndrome. For sore throat or cough try using a honey-based tea. Use 3 teaspoons of honey with juice squeezed from half lemon. Place shaved pieces of ginger into 1/2-1 cup of water and warm over stove top. Then mix the ingredients and repeat every 4 hours as needed. Please take Tylenol 500mg  every 6 hours. Hydrate very well with at least 2 liters of water. Eat light meals such as soups to replenish electrolytes and soft fruits, veggies. Start an antihistamine like Zyrtec, Allegra or Claritin for postnasal drainage, sinus congestion.  You can take this together with pseudoephedrine (Sudafed) at a dose of 60 mg 3 times a day oral 120 mg twice daily as needed for the same kind of congestion.  Make sure you ask the pharmacist for Sudafed.

## 2019-05-08 LAB — NOVEL CORONAVIRUS, NAA (HOSP ORDER, SEND-OUT TO REF LAB; TAT 18-24 HRS): SARS-CoV-2, NAA: NOT DETECTED

## 2019-07-01 ENCOUNTER — Encounter (HOSPITAL_COMMUNITY): Payer: Self-pay

## 2019-07-01 ENCOUNTER — Ambulatory Visit (HOSPITAL_COMMUNITY)
Admission: EM | Admit: 2019-07-01 | Discharge: 2019-07-01 | Disposition: A | Discharge status: Home / Self Care | Payer: Managed Care, Other (non HMO) | Attending: Family Medicine | Admitting: Family Medicine

## 2019-07-01 ENCOUNTER — Other Ambulatory Visit: Payer: Self-pay

## 2019-07-01 DIAGNOSIS — Z202 Contact with and (suspected) exposure to infections with a predominantly sexual mode of transmission: Secondary | ICD-10-CM

## 2019-07-01 DIAGNOSIS — Z113 Encounter for screening for infections with a predominantly sexual mode of transmission: Secondary | ICD-10-CM | POA: Diagnosis not present

## 2019-07-01 MED ORDER — CEFTRIAXONE SODIUM 250 MG IJ SOLR
INTRAMUSCULAR | Status: AC
Start: 2019-07-01 — End: ?
  Filled 2019-07-01: qty 250

## 2019-07-01 MED ORDER — CEFTRIAXONE SODIUM 250 MG IJ SOLR
250.0000 mg | Freq: Once | INTRAMUSCULAR | Status: AC
  Administered 2019-07-01: 250 mg via INTRAMUSCULAR

## 2019-07-01 MED ORDER — AZITHROMYCIN 250 MG PO TABS
1000.0000 mg | ORAL_TABLET | Freq: Once | ORAL | Status: AC
  Administered 2019-07-01: 1000 mg via ORAL

## 2019-07-01 MED ORDER — AZITHROMYCIN 250 MG PO TABS
ORAL_TABLET | ORAL | Status: AC
  Filled 2019-07-01: qty 4

## 2019-07-01 NOTE — Discharge Instructions (Addendum)
We have screened you for STDs and will call with any positive results.

## 2019-07-01 NOTE — ED Triage Notes (Signed)
Pt presents for STD Testing; pt states he is not having any symptoms. 

## 2019-07-02 NOTE — ED Provider Notes (Signed)
[place]    CSN: [id number] Arrival date & time: 07/01/19  6314      History   Chief Complaint Chief Complaint  Patient presents with  . STD Testing    HPI Ronnald D Belisle is a 33 y.o. male.   Patient is a 32 year old male presents today for STD testing.  Reporting he has had some intermittent tingling after urination.  This is been constant over the past week or so.  Did have unprotected sex prior to this starting.  Denies any penile discharge, penile swelling, testicle pain or swelling.  ROS per HPI      Past Medical History:  Diagnosis Date  . Tobacco use     There are no active problems to display for this patient.   History reviewed. No pertinent surgical history.     Home Medications    Prior to Admission medications   Medication Sig Start Date End Date Taking? Authorizing Provider  cyclobenzaprine (FLEXERIL) 10 MG tablet Take 1 tablet (10 mg total) by mouth 2 (two) times daily as needed for muscle spasms. 04/29/19   Sharion Balloon, NP  hydrOXYzine (ATARAX/VISTARIL) 25 MG tablet Take 1 tablet (25 mg total) by mouth every 6 (six) hours. 04/13/19   McDonald, Mia A, PA-C  ibuprofen (ADVIL) 800 MG tablet Take 1 tablet (800 mg total) by mouth every 8 (eight) hours as needed. 04/29/19   Sharion Balloon, NP    Family History Family History  Problem Relation Age of Onset  . Hypertension Maternal Grandmother     Social History Social History   Tobacco Use  . Smoking status: Current Every Day Smoker    Types: Cigars  . Smokeless tobacco: Never Used  . Tobacco comment: 2-3 a day  Substance Use Topics  . Alcohol use: Yes    Alcohol/week: 6.0 standard drinks    Types: 6 Standard drinks or equivalent per week  . Drug use: No     Allergies   Patient has no known allergies.   Review of Systems Review of Systems   Physical Exam Triage Vital Signs ED Triage Vitals  Enc Vitals Group     BP 07/01/19 1117 117/65     Pulse Rate 07/01/19  1117 92     Resp 07/01/19 1117 18     Temp 07/01/19 1117 97.6 F (36.4 C)     Temp Source 07/01/19 1117 Oral     SpO2 07/01/19 1117 95 %     Weight --      Height --      Head Circumference --      Peak Flow --      Pain Score 07/01/19 1118 0     Pain Loc --      Pain Edu? --      Excl. in [place]? --    No data found.  Updated Vital Signs BP 117/65 (BP Location: Right Arm)   Pulse 92   Temp 97.6 F (36.4 C) (Oral)   Resp 18   SpO2 95%   Visual Acuity Right Eye Distance:   Left Eye Distance:   Bilateral Distance:    Right Eye Near:   Left Eye Near:    Bilateral Near:     Physical Exam Vitals signs and nursing note reviewed.  Constitutional:      Appearance: Normal appearance.  HENT:     Head: Normocephalic and atraumatic.     Nose: Nose normal.  Eyes:  Conjunctiva/sclera: Conjunctivae normal.  Neck:     Musculoskeletal: Normal range of motion.  Pulmonary:     Effort: Pulmonary effort is normal.  Abdominal:     Palpations: Abdomen is soft.     Tenderness: There is no abdominal tenderness.  Musculoskeletal: Normal range of motion.  Skin:    General: Skin is warm and dry.  Neurological:     Mental Status: He is alert.  Psychiatric:        Mood and Affect: Mood normal.      UC Treatments / Results  Labs (all labs ordered are listed, but only abnormal results are displayed) Labs Reviewed  CYTOLOGY, (ORAL, ANAL, URETHRAL) ANCILLARY ONLY    EKG   Radiology No results found.  Procedures Procedures (including critical care time)  Medications Ordered in UC Medications  cefTRIAXone (ROCEPHIN) injection 250 mg (250 mg Intramuscular Given 07/01/19 1204)  azithromycin (ZITHROMAX) tablet 1,000 mg (1,000 mg Oral Given 07/01/19 1204)  azithromycin (ZITHROMAX) 250 MG tablet (has no administration in time range)  cefTRIAXone (ROCEPHIN) 250 MG injection (has no administration in time range)    Initial Impression / Assessment and Plan / UC Course  I have  reviewed the triage vital signs and the nursing notes.  Pertinent labs & imaging results that were available during my care of the patient were reviewed by me and considered in my medical decision making (see chart for details).     Screening for STDs-swab sent for testing and opted for prophylactic treatment based on symptoms and sexual encounter. Final Clinical Impressions(s) / UC Diagnoses   Final diagnoses:  Screening for STD (sexually transmitted disease)     Discharge Instructions     We have screened you for STDs and will call with any positive results.     ED Prescriptions    None     PDMP not reviewed this encounter.   Janace Aris, NP 07/02/19 1312

## 2019-07-04 LAB — CYTOLOGY, (ORAL, ANAL, URETHRAL) ANCILLARY ONLY
Chlamydia: NEGATIVE
Neisseria Gonorrhea: NEGATIVE
Trichomonas: NEGATIVE

## 2019-08-16 ENCOUNTER — Encounter (HOSPITAL_COMMUNITY): Payer: Self-pay | Admitting: Urgent Care

## 2019-08-16 ENCOUNTER — Ambulatory Visit (HOSPITAL_COMMUNITY)
Admission: EM | Admit: 2019-08-16 | Discharge: 2019-08-16 | Disposition: A | Discharge status: Home / Self Care | Payer: Managed Care, Other (non HMO)

## 2019-08-16 ENCOUNTER — Other Ambulatory Visit: Payer: Self-pay

## 2019-08-16 NOTE — ED Notes (Signed)
Patient called again with not response.

## 2019-08-16 NOTE — ED Notes (Signed)
Called x 4 no answer

## 2019-08-16 NOTE — ED Notes (Signed)
Patient called x 2 with no response. 

## 2019-12-11 ENCOUNTER — Emergency Department (HOSPITAL_COMMUNITY)
Admission: EM | Admit: 2019-12-11 | Discharge: 2019-12-12 | Disposition: A | Discharge status: Home / Self Care | Payer: Managed Care, Other (non HMO) | Attending: Emergency Medicine | Admitting: Emergency Medicine

## 2019-12-11 ENCOUNTER — Other Ambulatory Visit: Payer: Self-pay

## 2019-12-11 DIAGNOSIS — Z720 Tobacco use: Secondary | ICD-10-CM | POA: Insufficient documentation

## 2019-12-11 DIAGNOSIS — Z79899 Other long term (current) drug therapy: Secondary | ICD-10-CM | POA: Diagnosis not present

## 2019-12-11 DIAGNOSIS — M545 Low back pain, unspecified: Secondary | ICD-10-CM

## 2019-12-11 DIAGNOSIS — G8929 Other chronic pain: Secondary | ICD-10-CM | POA: Diagnosis not present

## 2019-12-11 NOTE — ED Triage Notes (Signed)
Pt states he bent over to pick up a box and hurt his back. States been having back problems since he was 16.

## 2019-12-12 MED ORDER — IBUPROFEN 800 MG PO TABS: 800.0000 mg | ORAL_TABLET | Freq: Three times a day (TID) | ORAL | 0 refills | Status: DC | PRN

## 2019-12-12 MED ORDER — IBUPROFEN 400 MG PO TABS
600.0000 mg | ORAL_TABLET | Freq: Once | ORAL | Status: AC
  Administered 2019-12-12: 600 mg via ORAL
  Filled 2019-12-12: qty 1

## 2019-12-12 NOTE — ED Provider Notes (Signed)
[facility] EMERGENCY DEPARTMENT Provider Note   CSN: [id number] Arrival date & time: 12/11/19  2230     History Chief Complaint  Patient presents with  . Back Pain    Gary Ware is a 34 y.o. male.  Patient with history of recurrent left lower back pain presents with left lower back pain after heavy lifting while at work earlier [employer]. No radiation of the pain. No bowel/bladder dysfunction. No numbness, weakness of the lower extremities. He has a muscle relaxer at home but has not taken anything to try to relieve symptoms.   The history is provided by the patient. No language interpreter was used.  Back Pain Associated symptoms: no abdominal pain, no numbness and no weakness        Past Medical History:  Diagnosis Date  . Tobacco use     There are no problems to display for this patient.   No past surgical history on file.     Family History  Problem Relation Age of Onset  . Hypertension Maternal Grandmother     Social History   Tobacco Use  . Smoking status: Current Every Day Smoker    Types: Cigars  . Smokeless tobacco: Never Used  . Tobacco comment: 2-3 a day  Substance Use Topics  . Alcohol use: Yes    Alcohol/week: 6.0 standard drinks    Types: 6 Standard drinks or equivalent per week  . Drug use: No    Home Medications Prior to Admission medications   Medication Sig Start Date End Date Taking? Authorizing Provider  cyclobenzaprine (FLEXERIL) 10 MG tablet Take 1 tablet (10 mg total) by mouth 2 (two) times daily as needed for muscle spasms. 04/29/19   Sharion Balloon, NP  hydrOXYzine (ATARAX/VISTARIL) 25 MG tablet Take 1 tablet (25 mg total) by mouth every 6 (six) hours. 04/13/19   McDonald, Mia A, PA-C  ibuprofen (ADVIL) 800 MG tablet Take 1 tablet (800 mg total) by mouth every 8 (eight) hours as needed. 04/29/19   Sharion Balloon, NP    Allergies    Patient has no known allergies.  Review of Systems   Review of Systems    Constitutional: Negative for diaphoresis.  Gastrointestinal: Negative.  Negative for abdominal pain.  Genitourinary: Negative for enuresis.  Musculoskeletal: Positive for back pain.       See HPI  Skin: Negative.   Neurological: Negative.  Negative for weakness and numbness.    Physical Exam Updated Vital Signs BP 129/83 (BP Location: Left Arm)   Pulse 90   Temp 98.8 F (37.1 C) (Oral)   Resp 16   SpO2 98%   Physical Exam Constitutional:      Appearance: He is well-developed.  Cardiovascular:     Pulses: Normal pulses.  Pulmonary:     Effort: Pulmonary effort is normal.  Musculoskeletal:        General: Normal range of motion.     Cervical back: Normal range of motion.       Back:     Comments: Mild tenderness of left paralumbar back.   Skin:    General: Skin is warm and dry.  Neurological:     Mental Status: He is alert and oriented to person, place, and time.     Sensory: No sensory deficit.     Deep Tendon Reflexes: Reflexes normal.     ED Results / Procedures / Treatments   Labs (all labs ordered are listed, but only abnormal results  are displayed) Labs Reviewed - No data to display  EKG None  Radiology No results found.  Procedures Procedures (including critical care time)  Medications Ordered in ED Medications - No data to display  ED Course  I have reviewed the triage vital signs and the nursing notes.  Pertinent labs & imaging results that were available during my care of the patient were reviewed by me and considered in my medical decision making (see chart for details).    MDM Rules/Calculators/A&P                      Patient to ED with acute on chronic left lower back pain after heavy lifting. No neurologic deficits. Already had muscle relaxers at home, felt appropriate. Will add 600 mg ibuprofen, suggest warm compresses. Will provide orthopedic referral.   Final Clinical Impression(s) / ED Diagnoses Final diagnoses:  None   1.  Acute on chronic left low back pain  Rx / DC Orders ED Discharge Orders    None       Elpidio Anis, PA-C 12/12/19 0143    Nira Conn, MD 12/12/19 [phone]

## 2019-12-12 NOTE — Discharge Instructions (Signed)
Continue use of your muscle relaxer at home as prescribed. Take ibuprofen anti-inflammatory pain reliever as well.   Follow up with orthopedics for further evaluation and management of low back pain.

## 2019-12-18 ENCOUNTER — Ambulatory Visit (HOSPITAL_COMMUNITY)
Admission: EM | Admit: 2019-12-18 | Discharge: 2019-12-18 | Disposition: A | Discharge status: Home / Self Care | Payer: Managed Care, Other (non HMO) | Attending: Family Medicine | Admitting: Family Medicine

## 2019-12-18 ENCOUNTER — Other Ambulatory Visit: Payer: Self-pay

## 2019-12-18 ENCOUNTER — Encounter (HOSPITAL_COMMUNITY): Payer: Self-pay

## 2019-12-18 DIAGNOSIS — M545 Low back pain, unspecified: Secondary | ICD-10-CM

## 2019-12-18 DIAGNOSIS — Z20822 Contact with and (suspected) exposure to covid-19: Secondary | ICD-10-CM | POA: Diagnosis present

## 2019-12-18 DIAGNOSIS — U071 COVID-19: Secondary | ICD-10-CM | POA: Diagnosis not present

## 2019-12-18 DIAGNOSIS — Z1152 Encounter for screening for COVID-19: Secondary | ICD-10-CM | POA: Diagnosis not present

## 2019-12-18 MED ORDER — TRAMADOL HCL 50 MG PO TABS: 50.0000 mg | ORAL_TABLET | Freq: Four times a day (QID) | ORAL | 0 refills | Status: DC | PRN

## 2019-12-18 MED ORDER — CYCLOBENZAPRINE HCL 10 MG PO TABS
10.0000 mg | ORAL_TABLET | Freq: Two times a day (BID) | ORAL | 0 refills | Status: DC | PRN
Start: 2019-12-18 — End: 2023-05-05

## 2019-12-18 MED ORDER — NAPROXEN 500 MG PO TABS
500.0000 mg | ORAL_TABLET | Freq: Two times a day (BID) | ORAL | 0 refills | Status: DC
Start: 2019-12-18 — End: 2023-05-05

## 2019-12-18 NOTE — Discharge Instructions (Signed)
Your COVID test is pending.  You should self quarantine until the test result is back.    Take Tylenol as needed for fever or discomfort.  Rest and keep yourself hydrated.    Go to the emergency department if you develop shortness of breath, severe diarrhea, high fever not relieved by Tylenol or ibuprofen, or other concerning symptoms.    

## 2019-12-18 NOTE — ED Provider Notes (Signed)
[facility]    CSN: [id number] Arrival date & time: 12/18/19  1549      History   Chief Complaint Chief Complaint  Patient presents with  . Back Pain  . COVID Test    HPI Gary Ware is a 34 y.o. male.   Reports that he has been having back pain on and off for years, reports that it has been worse over the last 3 days.  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  Reports that he is taking 600 mg of ibuprofen with no relief.  Reports that he has never seen a specialist for this.  Denies headache, cough, shortness of breath, nausea, vomiting, diarrhea, rash, fever, other symptoms.  Per chart review, patient has no significant medical history.  He is requesting Covid testing today.  Denies sick contacts.  Denies any symptoms or known exposures.  ROS per HPI  The history is provided by the patient.    Past Medical History:  Diagnosis Date  . Tobacco use     There are no problems to display for this patient.   History reviewed. No pertinent surgical history.     Home Medications    Prior to Admission medications   Medication Sig Start Date End Date Taking? Authorizing Provider  cyclobenzaprine (FLEXERIL) 10 MG tablet Take 1 tablet (10 mg total) by mouth 2 (two) times daily as needed for muscle spasms. 12/18/19   Moshe Cipro, NP  hydrOXYzine (ATARAX/VISTARIL) 25 MG tablet Take 1 tablet (25 mg total) by mouth every 6 (six) hours. 04/13/19   McDonald, Mia A, PA-C  ibuprofen (ADVIL) 800 MG tablet Take 1 tablet (800 mg total) by mouth every 8 (eight) hours as needed. 12/12/19   Elpidio Anis, PA-C  naproxen (NAPROSYN) 500 MG tablet Take 1 tablet (500 mg total) by mouth 2 (two) times daily. 12/18/19   Moshe Cipro, NP  traMADol (ULTRAM) 50 MG tablet Take 1 tablet (50 mg total) by mouth every 6 (six) hours as needed. 12/18/19   Moshe Cipro, NP    Family History Family History  Problem Relation Age of Onset  .  Hypertension Maternal Grandmother     Social History Social History   Tobacco Use  . Smoking status: Current Some Day Smoker    Types: Cigars  . Smokeless tobacco: Never Used  . Tobacco comment: 2-3 a day  Substance Use Topics  . Alcohol use: Yes    Alcohol/week: 6.0 standard drinks    Types: 6 Standard drinks or equivalent per week  . Drug use: No     Allergies   Patient has no known allergies.   Review of Systems Review of Systems   Physical Exam Triage Vital Signs ED Triage Vitals  Enc Vitals Group     BP 12/18/19 1611 126/74     Pulse Rate 12/18/19 1611 87     Resp 12/18/19 1611 16     Temp 12/18/19 1611 98.4 F (36.9 C)     Temp Source 12/18/19 1611 Oral     SpO2 12/18/19 1611 100 %     Weight --      Height --      Head Circumference --      Peak Flow --      Pain Score 12/18/19 1609 6     Pain Loc --      Pain Edu? --      Excl. in [place]? --    No  data found.  Updated Vital Signs BP 126/74 (BP Location: Right Arm)   Pulse 87   Temp 98.4 F (36.9 C) (Oral)   Resp 16   SpO2 100%   Visual Acuity Right Eye Distance:   Left Eye Distance:   Bilateral Distance:    Right Eye Near:   Left Eye Near:    Bilateral Near:     Physical Exam Vitals and nursing note reviewed.  Constitutional:      General: He is not in acute distress.    Appearance: Normal appearance. He is well-developed and normal weight. He is not ill-appearing.  HENT:     Head: Normocephalic and atraumatic.  Eyes:     Conjunctiva/sclera: Conjunctivae normal.  Cardiovascular:     Rate and Rhythm: Normal rate and regular rhythm.     Heart sounds: No murmur.  Pulmonary:     Effort: Pulmonary effort is normal. No respiratory distress.     Breath sounds: Normal breath sounds. No stridor. No wheezing, rhonchi or rales.  Chest:     Chest wall: No tenderness.  Abdominal:     General: Bowel sounds are normal.     Palpations: Abdomen is soft.     Tenderness: There is no abdominal  tenderness.  Musculoskeletal:        General: Tenderness present. No swelling, deformity or signs of injury.     Cervical back: Normal range of motion and neck supple.     Right lower leg: No edema.     Left lower leg: No edema.     Comments: Tenderness to right low back on palpation.  Limited range of motion with twisting and bending.  Skin:    General: Skin is warm and dry.     Capillary Refill: Capillary refill takes less than 2 seconds.  Neurological:     General: No focal deficit present.     Mental Status: He is alert and oriented to person, place, and time.  Psychiatric:        Mood and Affect: Mood normal.        Behavior: Behavior normal.        Thought Content: Thought content normal.      UC Treatments / Results  Labs (all labs ordered are listed, but only abnormal results are displayed) Labs Reviewed  SARS CORONAVIRUS 2 (TAT 6-24 HRS) - Abnormal; Notable for the following components:      Result Value   SARS Coronavirus 2 POSITIVE (*)    All other components within normal limits    EKG   Radiology No results found.  Procedures Procedures (including critical care time)  Medications Ordered in UC Medications - No data to display  Initial Impression / Assessment and Plan / UC Course  I have reviewed the triage vital signs and the nursing notes.  Pertinent labs & imaging results that were available during my care of the patient were reviewed by me and considered in my medical decision making (see chart for details).     Covid Screen Low back pain: Presents with low back pain that is worsened over the last 3 days.  He has taken ibuprofen with no relief.  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  On exam, patient is alert, no acute distress noted.  Lungs CTA bilaterally, heart sounds normal S1-S2.  No abdominal tenderness noted.  He does have tenderness to the right  side of his lower  back, no swelling, no obvious sign of deformity.  Negative straight leg raises bilaterally.  Prescribed naproxen 500 mg twice daily as needed for inflammation.  Prescribed Flexeril 10 mg twice daily as needed for muscle spasms.  Also prescribed tramadol 50 mg 1 tablet every 6 hours as needed for moderate to severe or breakthrough pain. Covid swab obtained in office today.  Patient instructed to quarantine until results are back and negative.  If results are negative, patient may resume daily schedule as tolerated once they are fever free for 24 hours without the use of antipyretic medications.  If results are positive, patient instructed to quarantine 10 days from today.  Patient instructed to follow-up with primary care with this office as needed.  Patient instructed to follow-up in the ER for trouble swallowing, trouble breathing, other concerning symptoms.    Final Clinical Impressions(s) / UC Diagnoses   Final diagnoses:  Acute right-sided low back pain without sciatica  Encounter for screening for COVID-19     Discharge Instructions     Your COVID test is pending.  You should self quarantine until the test result is back.    Take Tylenol as needed for fever or discomfort.  Rest and keep yourself hydrated.    Go to the emergency department if you develop shortness of breath, severe diarrhea, high fever not relieved by Tylenol or ibuprofen, or other concerning symptoms.       ED Prescriptions    Medication Sig Dispense Auth. Provider   cyclobenzaprine (FLEXERIL) 10 MG tablet Take 1 tablet (10 mg total) by mouth 2 (two) times daily as needed for muscle spasms. 20 tablet Moshe Cipro, NP   naproxen (NAPROSYN) 500 MG tablet Take 1 tablet (500 mg total) by mouth 2 (two) times daily. 30 tablet Moshe Cipro, NP   traMADol (ULTRAM) 50 MG tablet Take 1 tablet (50 mg total) by mouth every 6 (six) hours as needed. 15 tablet Moshe Cipro, NP     I have reviewed the PDMP  during this encounter.   Moshe Cipro, NP 12/19/19 [phone]

## 2019-12-18 NOTE — ED Triage Notes (Signed)
C/o lower back pain. Woke up with it this morning. Also requesting COVID testing; no symptoms.

## 2019-12-19 LAB — SARS CORONAVIRUS 2 (TAT 6-24 HRS): SARS Coronavirus 2: POSITIVE — AB

## 2020-01-15 ENCOUNTER — Encounter (HOSPITAL_COMMUNITY): Payer: Self-pay | Admitting: Emergency Medicine

## 2020-01-15 ENCOUNTER — Emergency Department (HOSPITAL_COMMUNITY): Payer: Managed Care, Other (non HMO)

## 2020-01-15 ENCOUNTER — Emergency Department (HOSPITAL_COMMUNITY)
Admission: EM | Admit: 2020-01-15 | Discharge: 2020-01-15 | Disposition: A | Discharge status: Home / Self Care | Payer: Managed Care, Other (non HMO) | Attending: Emergency Medicine | Admitting: Emergency Medicine

## 2020-01-15 DIAGNOSIS — Z79899 Other long term (current) drug therapy: Secondary | ICD-10-CM | POA: Diagnosis not present

## 2020-01-15 DIAGNOSIS — F17290 Nicotine dependence, other tobacco product, uncomplicated: Secondary | ICD-10-CM | POA: Insufficient documentation

## 2020-01-15 DIAGNOSIS — R0789 Other chest pain: Secondary | ICD-10-CM | POA: Insufficient documentation

## 2020-01-15 MED ORDER — LIDOCAINE 5 % EX PTCH
1.0000 | MEDICATED_PATCH | CUTANEOUS | Status: DC
  Filled 2020-01-15: qty 1

## 2020-01-15 MED ORDER — LIDOCAINE 5 % EX PTCH: 1.0000 | MEDICATED_PATCH | CUTANEOUS | 0 refills | Status: DC
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
## 2020-01-15 NOTE — ED Notes (Signed)
Pt refused EKG.

## 2020-01-19 ENCOUNTER — Emergency Department (HOSPITAL_COMMUNITY)
Admission: EM | Admit: 2020-01-19 | Discharge: 2020-01-20 | Disposition: A | Discharge status: Home / Self Care | Payer: Managed Care, Other (non HMO) | Attending: Emergency Medicine | Admitting: Emergency Medicine

## 2020-01-19 ENCOUNTER — Encounter (HOSPITAL_COMMUNITY): Payer: Self-pay

## 2020-01-19 DIAGNOSIS — Y9389 Activity, other specified: Secondary | ICD-10-CM | POA: Diagnosis not present

## 2020-01-19 DIAGNOSIS — Z79899 Other long term (current) drug therapy: Secondary | ICD-10-CM | POA: Insufficient documentation

## 2020-01-19 DIAGNOSIS — X503XXA Overexertion from repetitive movements, initial encounter: Secondary | ICD-10-CM | POA: Diagnosis not present

## 2020-01-19 DIAGNOSIS — Y9289 Other specified places as the place of occurrence of the external cause: Secondary | ICD-10-CM | POA: Diagnosis not present

## 2020-01-19 DIAGNOSIS — F17290 Nicotine dependence, other tobacco product, uncomplicated: Secondary | ICD-10-CM | POA: Insufficient documentation

## 2020-01-19 DIAGNOSIS — R0789 Other chest pain: Secondary | ICD-10-CM | POA: Insufficient documentation

## 2020-01-19 DIAGNOSIS — Y999 Unspecified external cause status: Secondary | ICD-10-CM | POA: Diagnosis not present

## 2020-01-19 LAB — CBC
HCT: 47.5 % (ref 39.0–52.0)
Hemoglobin: 15.8 g/dL (ref 13.0–17.0)
MCH: 29.9 pg (ref 26.0–34.0)
MCHC: 33.3 g/dL (ref 30.0–36.0)
MCV: 90 fL (ref 80.0–100.0)
Platelets: 277 10*3/uL (ref 150–400)
RBC: 5.28 MIL/uL (ref 4.22–5.81)
RDW: 12.2 % (ref 11.5–15.5)
WBC: 5.2 10*3/uL (ref 4.0–10.5)
nRBC: 0 % (ref 0.0–0.2)

## 2020-01-19 LAB — BASIC METABOLIC PANEL
Anion gap: 12 (ref 5–15)
BUN: 7 mg/dL (ref 6–20)
CO2: 25 mmol/L (ref 22–32)
Calcium: 9 mg/dL (ref 8.9–10.3)
Chloride: 104 mmol/L (ref 98–111)
Creatinine, Ser: 0.97 mg/dL (ref 0.61–1.24)
GFR calc Af Amer: >60 mL/min (ref >60)
GFR calc non Af Amer: >60 mL/min (ref >60)
Glucose, Bld: 95 mg/dL (ref 70–99)
Potassium: 3.9 mmol/L (ref 3.5–5.1)
Sodium: 141 mmol/L (ref 135–145)

## 2020-01-19 LAB — TROPONIN I (HIGH SENSITIVITY)
Troponin I (High Sensitivity): 5 ng/L (ref <18)
Troponin I (High Sensitivity): 3 ng/L (ref <18)

## 2020-01-19 NOTE — ED Notes (Signed)
IV was taken out in triage

## 2020-01-19 NOTE — ED Triage Notes (Signed)
Pt from home with ems for left sided chest pain for the past few days, pt seen for same, given a rx for muscle relaxer but did not get it filled. Pt given 324 ASA en route. Pt also c.o emesis due to alcohol consumption last night. Pt a.o, VSS

## 2020-01-20 ENCOUNTER — Emergency Department (HOSPITAL_COMMUNITY): Payer: Managed Care, Other (non HMO)

## 2020-01-20 MED ORDER — IBUPROFEN 800 MG PO TABS: 800.0000 mg | ORAL_TABLET | Freq: Three times a day (TID) | ORAL | 0 refills | Status: DC

## 2020-01-20 NOTE — ED Provider Notes (Signed)
[facility] EMERGENCY DEPARTMENT Provider Note   CSN: [id number] Arrival date & time: 01/19/20  1557     History Chief Complaint  Patient presents with  . Chest Pain    Gary Ware is a 34 y.o. male.  Patient presents to the ED with a chief complaint of chest wall pain.  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  He states that his left sided chest wall hurts when he stretches with overhead lifting.  It is also tender to touch.  He states that he was seen for the same about week ago and was prescribed muscle relaxer, but hasn't taken them because he doesn't like to take medication.  He denies any fevers, chills, or cough.  Denies any history of PE.  Denies any falls or trauma, other than repetitive lifting at work.  Denies any other associated symptoms.  The history is provided by the patient. No language interpreter was used.       Past Medical History:  Diagnosis Date  . Tobacco use     There are no problems to display for this patient.   History reviewed. No pertinent surgical history.     Family History  Problem Relation Age of Onset  . Hypertension Maternal Grandmother     Social History   Tobacco Use  . Smoking status: Current Some Day Smoker    Types: Cigars  . Smokeless tobacco: Never Used  . Tobacco comment: 2-3 a day  Substance Use Topics  . Alcohol use: Yes    Alcohol/week: 6.0 standard drinks    Types: 6 Standard drinks or equivalent per week  . Drug use: No    Home Medications Prior to Admission medications   Medication Sig Start Date End Date Taking? Authorizing Provider  cyclobenzaprine (FLEXERIL) 10 MG tablet Take 1 tablet (10 mg total) by mouth 2 (two) times daily as needed for muscle spasms. 12/18/19   Moshe Cipro, NP  hydrOXYzine (ATARAX/VISTARIL) 25 MG tablet Take 1 tablet (25 mg total) by mouth every 6 (six) hours. 04/13/19   McDonald, Mia A, PA-C  ibuprofen (ADVIL) 800 MG tablet Take 1 tablet (800 mg  total) by mouth 3 (three) times daily. 01/20/20   Roxy Horseman, PA-C  lidocaine (LIDODERM) 5 % Place 1 patch onto the skin daily. Remove & Discard patch within 12 hours or as directed by MD 01/15/20   Tilden Fossa, MD  naproxen (NAPROSYN) 500 MG tablet Take 1 tablet (500 mg total) by mouth 2 (two) times daily. 12/18/19   Moshe Cipro, NP  traMADol (ULTRAM) 50 MG tablet Take 1 tablet (50 mg total) by mouth every 6 (six) hours as needed. 12/18/19   Moshe Cipro, NP    Allergies    Patient has no known allergies.  Review of Systems   Review of Systems  All other systems reviewed and are negative.   Physical Exam Updated Vital Signs BP 125/71 (BP Location: Right Arm)   Pulse 81   Temp 98.4 F (36.9 C) (Oral)   Resp 18   SpO2 98%   Physical Exam Vitals and nursing note reviewed.  Constitutional:      Appearance: He is well-developed.  HENT:     Head: Normocephalic and atraumatic.  Eyes:     Conjunctiva/sclera: Conjunctivae normal.  Cardiovascular:     Rate and Rhythm: Normal rate and regular rhythm.     Heart sounds: No murmur.  Pulmonary:     Effort: Pulmonary  effort is normal. No respiratory distress.     Breath sounds: Normal breath sounds.     Comments: Left sided chest wall tenderness to palpation CTAB Chest:     Chest wall: Tenderness present.  Abdominal:     Palpations: Abdomen is soft.     Tenderness: There is no abdominal tenderness.  Musculoskeletal:        General: Normal range of motion.     Cervical back: Neck supple.  Skin:    General: Skin is warm and dry.  Neurological:     Mental Status: He is alert and oriented to person, place, and time.  Psychiatric:        Mood and Affect: Mood normal.        Behavior: Behavior normal.     ED Results / Procedures / Treatments   Labs (all labs ordered are listed, but only abnormal results are displayed) Labs Reviewed  BASIC METABOLIC PANEL  CBC  TROPONIN I (HIGH SENSITIVITY)  TROPONIN I  (HIGH SENSITIVITY)    EKG None ED ECG REPORT  I personally interpreted this EKG   Date: 01/20/2020   Rate: 89  Rhythm: normal sinus rhythm  QRS Axis: normal  Intervals: normal  ST/T Wave abnormalities: normal  Conduction Disutrbances:none  Narrative Interpretation:   Old EKG Reviewed: unchanged   Radiology DG Chest 2 View  Result Date: 01/20/2020 CLINICAL DATA:  Left-sided chest pain for 2 days EXAM: CHEST - 2 VIEW COMPARISON:  01/15/2020 FINDINGS: Cardiac shadow is within normal limits. The lungs are well aerated bilaterally without focal infiltrate or sizable effusion. No pneumothorax is noted. No bony abnormality is noted. IMPRESSION: No active cardiopulmonary disease. Electronically Signed   By: Inez Catalina M.D.   On: 01/20/2020 01:10    Procedures Procedures (including critical care time)  Medications Ordered in ED Medications - No data to display  ED Course  I have reviewed the triage vital signs and the nursing notes.  Pertinent labs & imaging results that were available during my care of the patient were reviewed by me and considered in my medical decision making (see chart for details).    MDM Rules/Calculators/A&P                      This patient complains of chest wall pain, this involves an extensive number of treatment options, and is a complaint that carries with it a high risk of complications and morbidity.  The differential diagnosis includes msk chest pain, muscle strain, rib fracture, shingles, PE, pneumonia.  Pertinent Labs I ordered, reviewed, and interpreted labs, which included CBC, BMP, troponin which were all reassuring.  Initial trop 5, repeat trop is 3.  Low risk for ACS.  Imaging Interpretation I ordered imaging studies which included CXR.  I independently visualized and interpreted the CXR, which showed no acute process.   Sources Previous records obtained and reviewed recent visit for the same with unremarkable workup.     Final  Clinical Impression(s) / ED Diagnoses Final diagnoses:  Chest wall pain    Rx / DC Orders ED Discharge Orders         Ordered    ibuprofen (ADVIL) 800 MG tablet  3 times daily     01/20/20 0127           Montine Circle, PA-C 01/20/20 0150    Orpah Greek, MD 01/20/20 [phone]

## 2020-07-23 IMAGING — DX LEFT ELBOW - 2 VIEW
2 series · 2 of 2 positions shown · non-contrast
Comparison: None.

CLINICAL DATA: Elbow pain, swelling

EXAM:
LEFT ELBOW - 2 VIEW

[elbow ap]
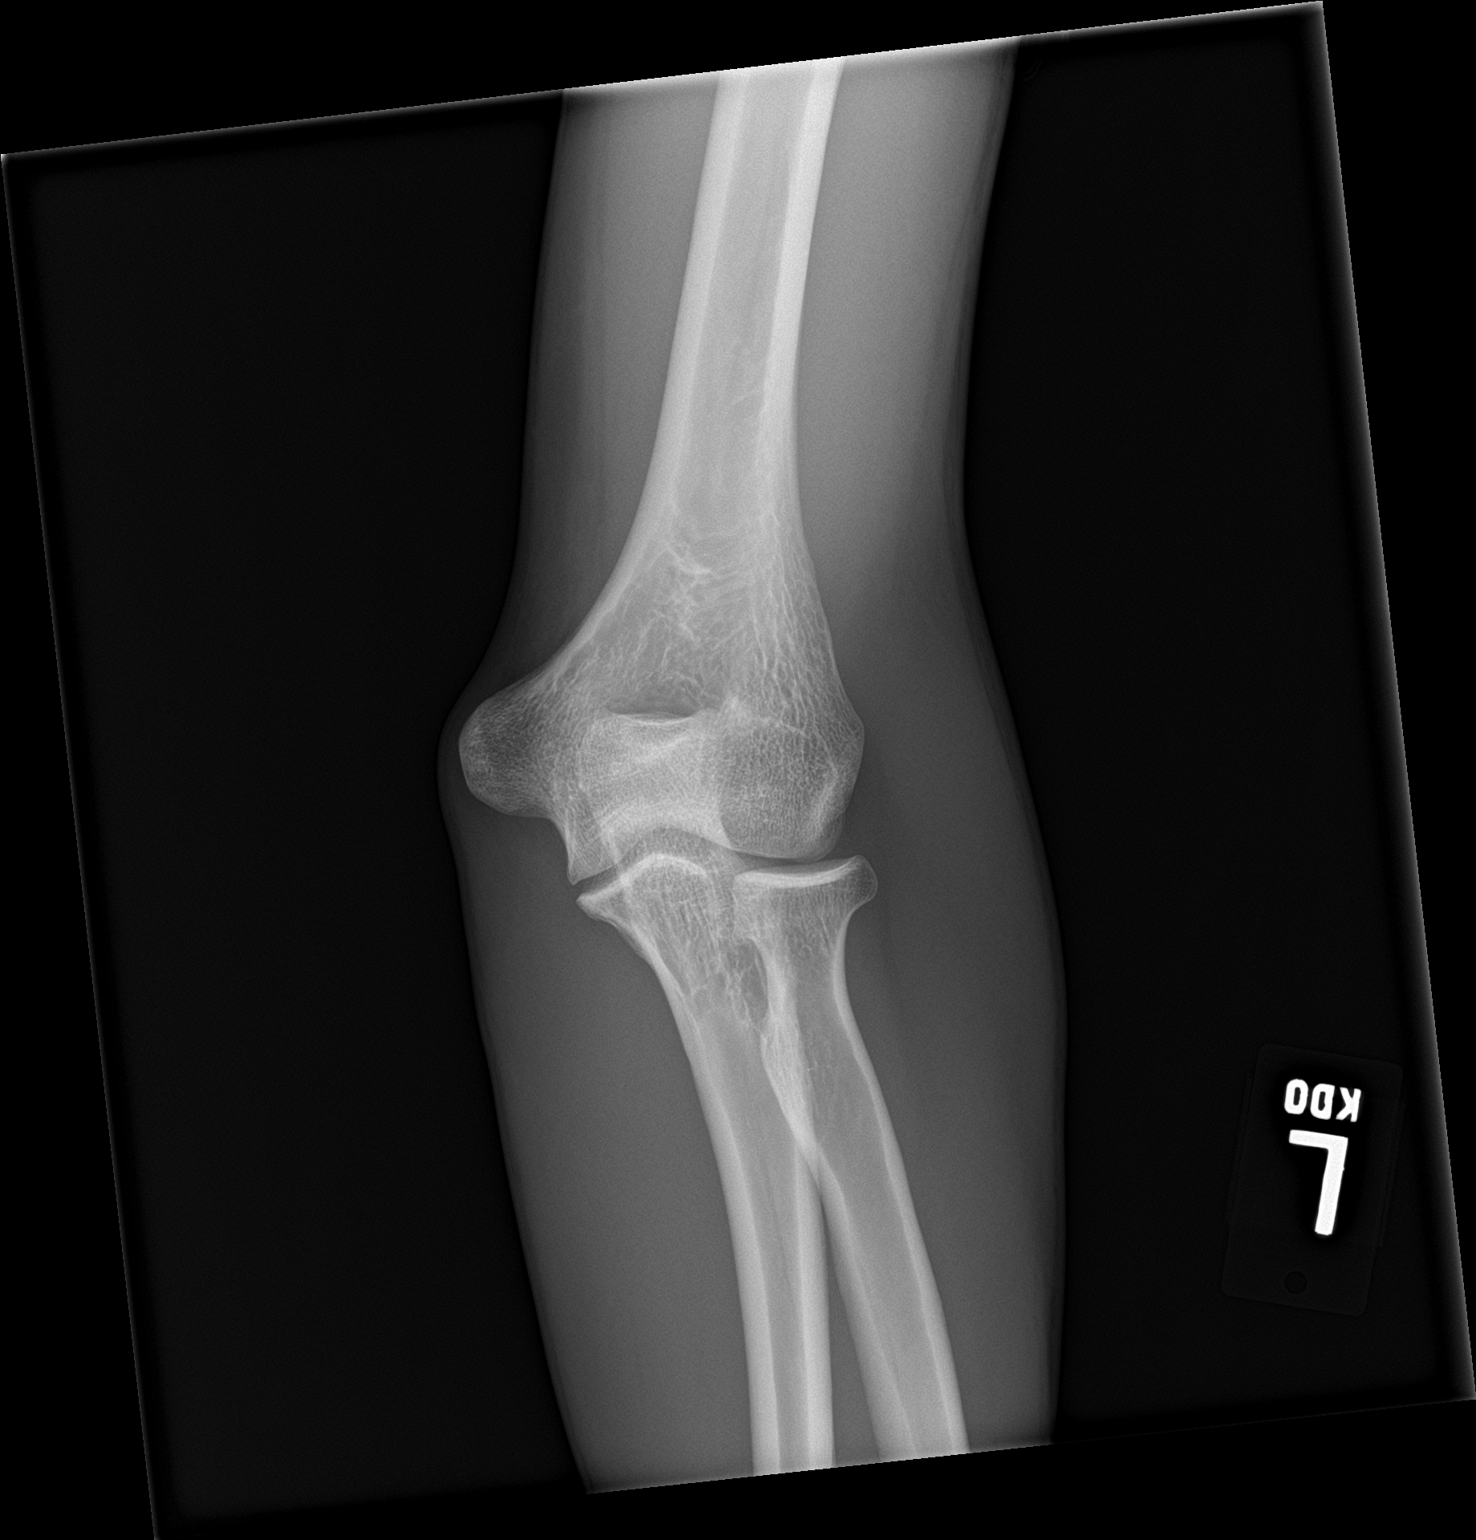

[elbow lat]
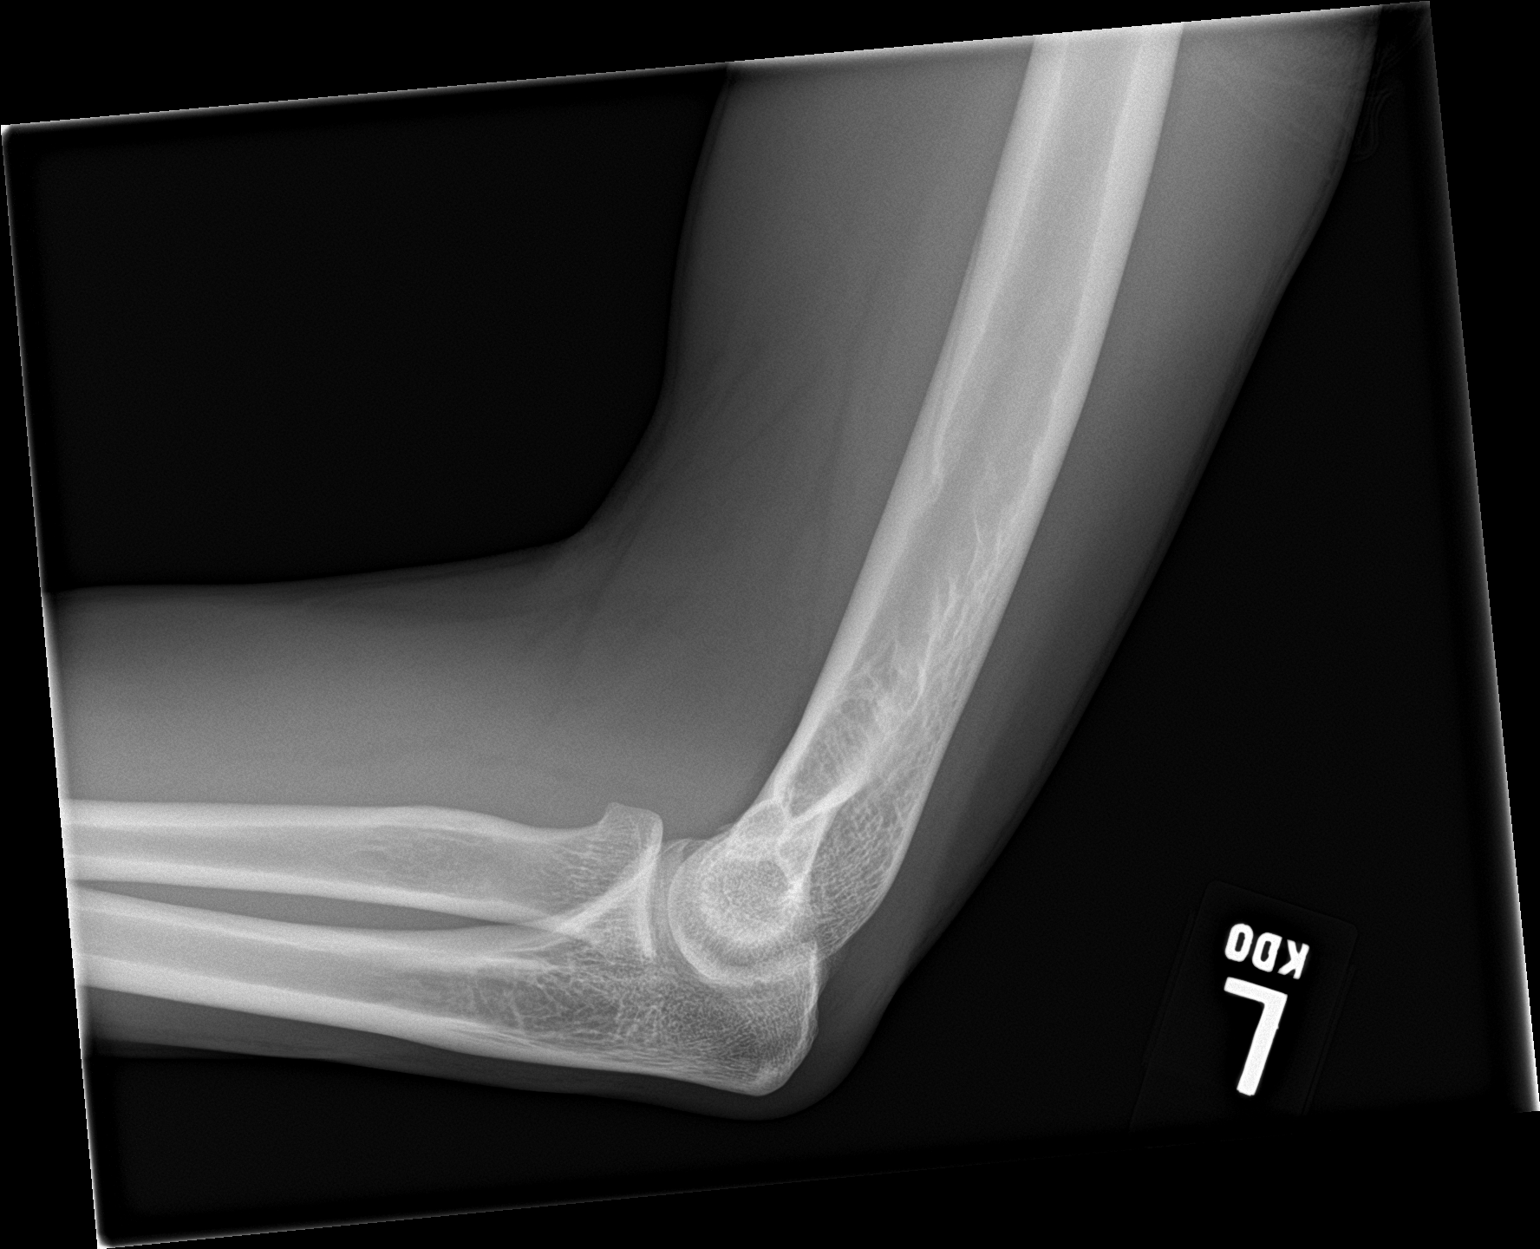

[2 of 2 positions shown; findings below may reference images not displayed]

FINDINGS: No fracture or malalignment.  No significant elbow effusion.
IMPRESSION: Negative.

## 2021-04-09 ENCOUNTER — Other Ambulatory Visit: Payer: Self-pay

## 2021-04-09 ENCOUNTER — Encounter (HOSPITAL_COMMUNITY): Payer: Self-pay

## 2021-04-09 ENCOUNTER — Ambulatory Visit (HOSPITAL_COMMUNITY)
Admission: EM | Admit: 2021-04-09 | Discharge: 2021-04-09 | Disposition: A | Discharge status: Home / Self Care | Payer: Self-pay | Attending: Urgent Care | Admitting: Urgent Care

## 2021-04-09 DIAGNOSIS — Z7251 High risk heterosexual behavior: Secondary | ICD-10-CM | POA: Insufficient documentation

## 2021-04-09 DIAGNOSIS — R369 Urethral discharge, unspecified: Secondary | ICD-10-CM | POA: Insufficient documentation

## 2021-04-09 LAB — HIV ANTIBODY (ROUTINE TESTING W REFLEX): HIV Screen 4th Generation wRfx: NONREACTIVE

## 2021-04-09 MED ORDER — CEFTRIAXONE SODIUM 500 MG IJ SOLR
500.0000 mg | Freq: Once | INTRAMUSCULAR | Status: AC
  Administered 2021-04-09: 500 mg via INTRAMUSCULAR

## 2021-04-09 MED ORDER — LIDOCAINE HCL (PF) 1 % IJ SOLN
INTRAMUSCULAR | Status: AC
  Filled 2021-04-09: qty 2

## 2021-04-09 MED ORDER — DOXYCYCLINE HYCLATE 100 MG PO CAPS
100.0000 mg | ORAL_CAPSULE | Freq: Two times a day (BID) | ORAL | 0 refills | Status: DC
Start: 2021-04-09 — End: 2023-05-05

## 2021-04-09 MED ORDER — CEFTRIAXONE SODIUM 500 MG IJ SOLR
INTRAMUSCULAR | Status: AC
  Filled 2021-04-09: qty 500

## 2021-04-09 NOTE — ED Triage Notes (Signed)
Pt presents for STD testing.   States he has itching and discharge X 2 days.

## 2021-04-09 NOTE — Discharge Instructions (Addendum)
Avoid all forms of sexual intercourse (oral, vaginal, anal) for the next 7 days to avoid spreading/reinfecting or at least until we can see what kinds of infection results are positive. Return if symptoms worsen/do not resolve, you develop fever, abdominal pain, blood in your urine, or are re-exposed to a sexually transmitted infection (STI).  

## 2021-04-09 NOTE — ED Provider Notes (Signed)
  [facility]   MRN: [id number] DOB: 20-Jan-1986  Subjective:   Gary Ware is a 35 y.o. male presenting for 2-day history of acute onset penile discharge, slight itching.  Patient had unprotected sex about a week ago. Denies dysuria, hematuria, urinary frequency, penile swelling, testicular pain, testicular swelling, anal pain, groin pain.   Denies taking chronic medications.    No Known Allergies  Past Medical History:  Diagnosis Date   Tobacco use      History reviewed. No pertinent surgical history.  Family History  Problem Relation Age of Onset   Hypertension Maternal Grandmother     Social History   Tobacco Use   Smoking status: Some Days    Types: Cigars   Smokeless tobacco: Never   Tobacco comments:    2-3 a day  Vaping Use   Vaping Use: Never used  Substance Use Topics   Alcohol use: Yes    Alcohol/week: 6.0 standard drinks    Types: 6 Standard drinks or equivalent per week   Drug use: No    ROS   Objective:   Vitals: BP 129/73   Pulse 74   Temp 98.1 F (36.7 C) (Oral)   Resp 20   SpO2 97%   Physical Exam Constitutional:      General: He is not in acute distress.    Appearance: Normal appearance. He is well-developed and normal weight. He is not ill-appearing, toxic-appearing or diaphoretic.  HENT:     Head: Normocephalic and atraumatic.     Right Ear: External ear normal.     Left Ear: External ear normal.     Nose: Nose normal.     Mouth/Throat:     Pharynx: Oropharynx is clear.  Eyes:     General: No scleral icterus.       Right eye: No discharge.        Left eye: No discharge.     Extraocular Movements: Extraocular movements intact.     Pupils: Pupils are equal, round, and reactive to light.  Cardiovascular:     Rate and Rhythm: Normal rate.  Pulmonary:     Effort: Pulmonary effort is normal.  Genitourinary:    Penis: Circumcised. Discharge present. No phimosis, paraphimosis, hypospadias, erythema, tenderness,  swelling or lesions.   Musculoskeletal:     Cervical back: Normal range of motion.  Neurological:     Mental Status: He is alert and oriented to person, place, and time.  Psychiatric:        Mood and Affect: Mood normal.        Behavior: Behavior normal.        Thought Content: Thought content normal.        Judgment: Judgment normal.    Assessment and Plan :   PDMP not reviewed this encounter.  1. Penile discharge   2. Unprotected sex     Patient treated empirically given physical exam findings and history of having had unprotected sex as per CDC guidelines with IM ceftriaxone, doxycycline as an outpatient.  Labs pending.   Counseled on safe sex practices including abstaining for 1 week following treatment.  Counseled patient on potential for adverse effects with medications prescribed/recommended today, ER and return-to-clinic precautions discussed, patient verbalized understanding.    Wallis Bamberg, [place] 04/09/21 [phone]

## 2021-04-10 LAB — CYTOLOGY, (ORAL, ANAL, URETHRAL) ANCILLARY ONLY
Chlamydia: NEGATIVE
Comment: NEGATIVE
Comment: NEGATIVE
Comment: NORMAL
Neisseria Gonorrhea: POSITIVE — AB
Trichomonas: NEGATIVE

## 2021-04-10 LAB — RPR: RPR Ser Ql: NONREACTIVE

## 2022-04-30 ENCOUNTER — Encounter (HOSPITAL_COMMUNITY): Payer: Self-pay

## 2022-04-30 ENCOUNTER — Ambulatory Visit (HOSPITAL_COMMUNITY)
Admission: EM | Admit: 2022-04-30 | Discharge: 2022-04-30 | Disposition: A | Discharge status: Home / Self Care | Payer: Self-pay | Attending: Physician Assistant | Admitting: Physician Assistant

## 2022-04-30 DIAGNOSIS — U071 COVID-19: Secondary | ICD-10-CM | POA: Insufficient documentation

## 2022-04-30 NOTE — ED Triage Notes (Signed)
Pt states nasal congestion and coughing with body aches + Covid test at  home.

## 2022-04-30 NOTE — ED Provider Notes (Signed)
[facility]    CSN: [id number] Arrival date & time: 04/30/22  1940      History   Chief Complaint Chief Complaint  Patient presents with   Nasal Congestion    HPI Gary Ware is a 36 y.o. male.   Patient here today for evaluation of nasal congestion and coughing with body aches.  He reports that symptoms started yesterday and he tested positive for COVID at home today.  He has not had any known fever.  He denies any nausea, vomiting or diarrhea.  He has tried over-the-counter medications with mild relief of symptoms.  The history is provided by the patient.    Past Medical History:  Diagnosis Date   Tobacco use     There are no problems to display for this patient.   History reviewed. No pertinent surgical history.     Home Medications    Prior to Admission medications   Medication Sig Start Date End Date Taking? Authorizing Provider  cyclobenzaprine (FLEXERIL) 10 MG tablet Take 1 tablet (10 mg total) by mouth 2 (two) times daily as needed for muscle spasms. 12/18/19   Moshe Cipro, NP  doxycycline (VIBRAMYCIN) 100 MG capsule Take 1 capsule (100 mg total) by mouth 2 (two) times daily. 04/09/21   Wallis Bamberg, PA-C  hydrOXYzine (ATARAX/VISTARIL) 25 MG tablet Take 1 tablet (25 mg total) by mouth every 6 (six) hours. 04/13/19   McDonald, Mia A, PA-C  ibuprofen (ADVIL) 800 MG tablet Take 1 tablet (800 mg total) by mouth 3 (three) times daily. 01/20/20   Roxy Horseman, PA-C  lidocaine (LIDODERM) 5 % Place 1 patch onto the skin daily. Remove & Discard patch within 12 hours or as directed by MD 01/15/20   Tilden Fossa, MD  naproxen (NAPROSYN) 500 MG tablet Take 1 tablet (500 mg total) by mouth 2 (two) times daily. 12/18/19   Moshe Cipro, NP  traMADol (ULTRAM) 50 MG tablet Take 1 tablet (50 mg total) by mouth every 6 (six) hours as needed. 12/18/19   Moshe Cipro, NP    Family History Family History  Problem Relation Age of Onset    Hypertension Maternal Grandmother     Social History Social History   Tobacco Use   Smoking status: Some Days    Types: Cigars   Smokeless tobacco: Never   Tobacco comments:    2-3 a day  Vaping Use   Vaping Use: Never used  Substance Use Topics   Alcohol use: Yes    Alcohol/week: 6.0 standard drinks of alcohol    Types: 6 Standard drinks or equivalent per week   Drug use: No     Allergies   Patient has no known allergies.   Review of Systems Review of Systems  Constitutional:  Negative for chills and fever.  HENT:  Positive for congestion. Negative for ear pain and sore throat.   Eyes:  Negative for discharge and redness.  Respiratory:  Positive for cough. Negative for shortness of breath.   Gastrointestinal:  Negative for abdominal pain, diarrhea, nausea and vomiting.  Musculoskeletal:  Positive for myalgias.     Physical Exam Triage Vital Signs ED Triage Vitals  Enc Vitals Group     BP 04/30/22 1954 106/69     Pulse Rate 04/30/22 1954 98     Resp 04/30/22 1954 16     Temp 04/30/22 1954 98.9 F (37.2 C)     Temp Source 04/30/22 1954 Oral     SpO2 04/30/22 1954  99 %     Weight --      Height --      Head Circumference --      Peak Flow --      Pain Score 04/30/22 1955 0     Pain Loc --      Pain Edu? --      Excl. in GC? --    No data found.  Updated Vital Signs BP 106/69 (BP Location: Left Arm)   Pulse 98   Temp 98.9 F (37.2 C) (Oral)   Resp 16   SpO2 99%      Physical Exam Vitals and nursing note reviewed.  Constitutional:      General: He is not in acute distress.    Appearance: He is well-developed. He is not ill-appearing.  HENT:     Head: Normocephalic and atraumatic.     Nose: Congestion present.  Eyes:     Conjunctiva/sclera: Conjunctivae normal.  Cardiovascular:     Rate and Rhythm: Normal rate and regular rhythm.     Heart sounds: Normal heart sounds. No murmur heard. Pulmonary:     Effort: Pulmonary effort is normal. No  respiratory distress.     Breath sounds: Normal breath sounds. No wheezing, rhonchi or rales.  Skin:    General: Skin is warm and dry.  Neurological:     Mental Status: He is alert.  Psychiatric:        Mood and Affect: Mood normal.        Behavior: Behavior normal.      UC Treatments / Results  Labs (all labs ordered are listed, but only abnormal results are displayed) Labs Reviewed  SARS CORONAVIRUS 2 (TAT 6-24 HRS)    EKG   Radiology No results found.  Procedures Procedures (including critical care time)  Medications Ordered in UC Medications - No data to display  Initial Impression / Assessment and Plan / UC Course  I have reviewed the triage vital signs and the nursing notes.  Pertinent labs & imaging results that were available during my care of the patient were reviewed by me and considered in my medical decision making (see chart for details).    Recommended continued symptomatic treatment, increased fluids and rest.  We will repeat COVID screening at patient's request.  Encouraged follow-up with any further concerns.  Final Clinical Impressions(s) / UC Diagnoses   Final diagnoses:  COVID-19   Discharge Instructions   None    ED Prescriptions   None    PDMP not reviewed this encounter.   Tomi Bamberger, PA-C 04/30/22 2028

## 2022-05-01 LAB — SARS CORONAVIRUS 2 (TAT 6-24 HRS): SARS Coronavirus 2: POSITIVE — AB

## 2023-05-05 ENCOUNTER — Encounter (HOSPITAL_COMMUNITY): Payer: Self-pay

## 2023-05-05 ENCOUNTER — Ambulatory Visit (HOSPITAL_COMMUNITY)
Admission: EM | Admit: 2023-05-05 | Discharge: 2023-05-05 | Disposition: A | Discharge status: Home / Self Care | Payer: BC Managed Care – PPO | Attending: Physician Assistant | Admitting: Physician Assistant

## 2023-05-05 DIAGNOSIS — S39012A Strain of muscle, fascia and tendon of lower back, initial encounter: Secondary | ICD-10-CM

## 2023-05-05 DIAGNOSIS — M545 Low back pain, unspecified: Secondary | ICD-10-CM

## 2023-05-05 MED ORDER — METHOCARBAMOL 500 MG PO TABS: 500.0000 mg | ORAL_TABLET | Freq: Two times a day (BID) | ORAL | 0 refills | Status: AC

## 2023-05-05 MED ORDER — IBUPROFEN 800 MG PO TABS: 800.0000 mg | ORAL_TABLET | Freq: Three times a day (TID) | ORAL | 0 refills | Status: AC

## 2023-05-05 NOTE — ED Provider Notes (Signed)
[facility]    CSN: [id number] Arrival date & time: 05/05/23  1245      History   Chief Complaint Chief Complaint  Patient presents with   Back Pain    HPI Gary Ware is a 37 y.o. male.   Patient presents today with several hour history of lower back pain.  He reports that when he went to wake up this morning he felt severe pain in his lower back.  He denies any known injury increase in activity prior to symptom onset.  Reports that when he was 16 he did injure his back but did not have ongoing pain after he went to a chiropractor a few years later.  He denies any previous spinal surgery.  Denies personal history of malignancy.  Reports that initially pain was rated 10 on a 0-10 pain scale but has improved and is currently rated 3, described as aching, worse with certain movements, no alleviating factors identified.  He denies any bowel/bladder incontinence, lower extremity weakness, saddle anesthesia.  He has tried ibuprofen with improvement but not resolution of symptoms.  He did call out of work as a result of his symptoms and is requesting a work excuse note.    Past Medical History:  Diagnosis Date   Tobacco use     There are no problems to display for this patient.   History reviewed. No pertinent surgical history.     Home Medications    Prior to Admission medications   Medication Sig Start Date End Date Taking? Authorizing Provider  ibuprofen (ADVIL) 800 MG tablet Take 1 tablet (800 mg total) by mouth 3 (three) times daily. 05/05/23  Yes Kenady Doxtater K, PA-C  methocarbamol (ROBAXIN) 500 MG tablet Take 1 tablet (500 mg total) by mouth 2 (two) times daily. 05/05/23  Yes Jaspal Pultz, Noberto Retort, PA-C    Family History Family History  Problem Relation Age of Onset   Hypertension Maternal Grandmother     Social History Social History   Tobacco Use   Smoking status: Some Days    Types: Cigars   Smokeless tobacco: Never   Tobacco comments:    2-3 a day   Vaping Use   Vaping status: Never Used  Substance Use Topics   Alcohol use: Yes    Alcohol/week: 6.0 standard drinks of alcohol    Types: 6 Standard drinks or equivalent per week   Drug use: No     Allergies   Patient has no known allergies.   Review of Systems Review of Systems  Constitutional:  Positive for activity change. Negative for appetite change, fatigue and fever.  Gastrointestinal:  Negative for abdominal pain, diarrhea, nausea and vomiting.  Genitourinary:  Negative for difficulty urinating.  Musculoskeletal:  Positive for back pain. Negative for arthralgias and myalgias.  Neurological:  Negative for weakness and numbness.     Physical Exam Triage Vital Signs ED Triage Vitals  Encounter Vitals Group     BP 05/05/23 1339 120/79     Systolic BP Percentile --      Diastolic BP Percentile --      Pulse Rate 05/05/23 1339 79     Resp 05/05/23 1339 14     Temp 05/05/23 1339 98.2 F (36.8 C)     Temp Source 05/05/23 1339 Oral     SpO2 05/05/23 1339 96 %     Weight --      Height --      Head Circumference --  Peak Flow --      Pain Score 05/05/23 1341 3     Pain Loc --      Pain Education --      Exclude from Growth Chart --    No data found.  Updated Vital Signs BP 120/79 (BP Location: Right Arm)   Pulse 79   Temp 98.2 F (36.8 C) (Oral)   Resp 14   SpO2 96%   Visual Acuity Right Eye Distance:   Left Eye Distance:   Bilateral Distance:    Right Eye Near:   Left Eye Near:    Bilateral Near:     Physical Exam Vitals reviewed.  Constitutional:      General: He is awake.     Appearance: Normal appearance. He is well-developed. He is not ill-appearing.     Comments: Very pleasant male appears stated age in no acute distress.  HENT:     Head: Normocephalic and atraumatic.  Cardiovascular:     Rate and Rhythm: Normal rate and regular rhythm.     Heart sounds: Normal heart sounds, S1 normal and S2 normal. No murmur heard. Pulmonary:      Effort: Pulmonary effort is normal.     Breath sounds: Normal breath sounds. No stridor. No wheezing, rhonchi or rales.     Comments: Clear to auscultation bilaterally Abdominal:     General: Bowel sounds are normal.     Palpations: Abdomen is soft.     Tenderness: There is no abdominal tenderness. There is no right CVA tenderness, left CVA tenderness, guarding or rebound.  Musculoskeletal:     Cervical back: No tenderness or bony tenderness.     Thoracic back: No tenderness or bony tenderness.     Lumbar back: Tenderness present. No bony tenderness. Negative right straight leg raise test and negative left straight leg raise test.     Comments: Back: Mild pain with palpation of bilateral paraspinal muscles.  No pain percussion of vertebrae.  No deformity or step-off noted.  Strength 5/5 bilateral lower extremities.  Negative straight leg raise.  Negative Faber.  Neurological:     Mental Status: He is alert.  Psychiatric:        Behavior: Behavior is cooperative.      UC Treatments / Results  Labs (all labs ordered are listed, but only abnormal results are displayed) Labs Reviewed - No data to display  EKG   Radiology No results found.  Procedures Procedures (including critical care time)  Medications Ordered in UC Medications - No data to display  Initial Impression / Assessment and Plan / UC Course  I have reviewed the triage vital signs and the nursing notes.  Pertinent labs & imaging results that were available during my care of the patient were reviewed by me and considered in my medical decision making (see chart for details).     Patient is well-appearing, afebrile, nontoxic, nontachycardic.  Patient denies any alarm symptoms that warrant emergent evaluation or imaging.  Plain films were deferred as he denies any recent trauma and has no focal bony tenderness.  Suspect muscular etiology.  He was started on ibuprofen 3 times daily with instruction not to take  additional NSAIDs with this medication due to risk of GI bleeding.  Can use acetaminophen/Tylenol for breakthrough pain.  Was prescribed Robaxin up to 2 times a day.  Discussed this can be sedating and he is not to drive or drink alcohol while taking it.  Recommended conservative treatment measures including  heat, rest, stretch.  He is to follow-up with sports medicine if his symptoms are not improving quickly and was given contact information for local provider with instruction to call to schedule an appointment.  Discussed that if he has any worsening symptoms including severe pain, bowel/bladder incontinence, lower extremity weakness, saddle anesthesia he needs to be seen immediately.  Strict return precautions given.  Work excuse note provided.   Final Clinical Impressions(s) / UC Diagnoses   Final diagnoses:  Strain of lumbar region, initial encounter  Acute bilateral low back pain without sciatica     Discharge Instructions      I believe that you have injured the muscles in your back.  Please start ibuprofen up to 3 times a day.  Do not take other NSAIDs with this medication including aspirin, ibuprofen/Advil, naproxen/Aleve.  You can use acetaminophen/Tylenol for breakthrough pain.  Take Robaxin up to 2 times a day.  This will make you sleepy so do not drive or drink alcohol taking it.  I recommend you follow-up with sports medicine; call to schedule an appointment.  If anything worsens and you have severe pain, going to the bathroom on yourself without noticing it, numbness or tingling in your legs, weakness in your legs you need to be seen immediately.      ED Prescriptions     Medication Sig Dispense Auth. Provider   ibuprofen (ADVIL) 800 MG tablet Take 1 tablet (800 mg total) by mouth 3 (three) times daily. 21 tablet Fender Herder K, PA-C   methocarbamol (ROBAXIN) 500 MG tablet Take 1 tablet (500 mg total) by mouth 2 (two) times daily. 20 tablet Annisten Manchester, Noberto Retort, PA-C      PDMP  not reviewed this encounter.   Jeani Hawking, PA-C 05/05/23 1407

## 2023-05-05 NOTE — Discharge Instructions (Signed)
I believe that you have injured the muscles in your back.  Please start ibuprofen up to 3 times a day.  Do not take other NSAIDs with this medication including aspirin, ibuprofen/Advil, naproxen/Aleve.  You can use acetaminophen/Tylenol for breakthrough pain.  Take Robaxin up to 2 times a day.  This will make you sleepy so do not drive or drink alcohol taking it.  I recommend you follow-up with sports medicine; call to schedule an appointment.  If anything worsens and you have severe pain, going to the bathroom on yourself without noticing it, numbness or tingling in your legs, weakness in your legs you need to be seen immediately.

## 2023-05-05 NOTE — ED Triage Notes (Signed)
Patient states he woke this AM and hd mid lower back pain and pain radiates down to the right hip.  Patient states he took Ibuprofen last night.
